# Patient Record
Sex: Female | Born: 1935 | Race: Black or African American | Hispanic: No | Marital: Single | Smoking: Former smoker
Health system: Southern US, Community
[De-identification: ages and names within clinical notes are randomized; demographics above are authoritative.]

## PROBLEM LIST (undated history)

## (undated) DIAGNOSIS — H18519 Endothelial corneal dystrophy, unspecified eye: Secondary | ICD-10-CM

## (undated) DIAGNOSIS — I10 Essential (primary) hypertension: Secondary | ICD-10-CM

## (undated) DIAGNOSIS — N189 Chronic kidney disease, unspecified: Secondary | ICD-10-CM

## (undated) DIAGNOSIS — F3289 Other specified depressive episodes: Secondary | ICD-10-CM

## (undated) DIAGNOSIS — N289 Disorder of kidney and ureter, unspecified: Secondary | ICD-10-CM

## (undated) DIAGNOSIS — H409 Unspecified glaucoma: Secondary | ICD-10-CM

## (undated) DIAGNOSIS — H548 Legal blindness, as defined in USA: Secondary | ICD-10-CM

## (undated) DIAGNOSIS — R52 Pain, unspecified: Secondary | ICD-10-CM

## (undated) DIAGNOSIS — H1851 Endothelial corneal dystrophy: Secondary | ICD-10-CM

## (undated) DIAGNOSIS — G309 Alzheimer's disease, unspecified: Secondary | ICD-10-CM

## (undated) DIAGNOSIS — F329 Major depressive disorder, single episode, unspecified: Secondary | ICD-10-CM

## (undated) DIAGNOSIS — F0390 Unspecified dementia without behavioral disturbance: Secondary | ICD-10-CM

## (undated) DIAGNOSIS — F0280 Dementia in other diseases classified elsewhere without behavioral disturbance: Secondary | ICD-10-CM

---

## 2000-10-25 ENCOUNTER — Emergency Department (HOSPITAL_COMMUNITY): Admission: EM | Admit: 2000-10-25 | Discharge: 2000-10-25 | Payer: Self-pay | Admitting: Emergency Medicine

## 2001-08-06 ENCOUNTER — Inpatient Hospital Stay (HOSPITAL_COMMUNITY): Admission: EM | Admit: 2001-08-06 | Discharge: 2001-08-07 | Payer: Self-pay | Admitting: Emergency Medicine

## 2006-03-31 ENCOUNTER — Emergency Department (HOSPITAL_COMMUNITY): Admission: EM | Admit: 2006-03-31 | Discharge: 2006-03-31 | Payer: Self-pay | Admitting: Emergency Medicine

## 2011-05-27 ENCOUNTER — Emergency Department (HOSPITAL_COMMUNITY): Payer: Medicare Other

## 2011-05-27 ENCOUNTER — Inpatient Hospital Stay (HOSPITAL_COMMUNITY)
Admission: EM | Admit: 2011-05-27 | Discharge: 2011-06-02 | DRG: 193 | Disposition: A | Discharge status: Skilled Nursing Facility | Payer: Medicare Other | Attending: Internal Medicine | Admitting: Internal Medicine

## 2011-05-27 DIAGNOSIS — F0390 Unspecified dementia without behavioral disturbance: Secondary | ICD-10-CM

## 2011-05-27 DIAGNOSIS — E876 Hypokalemia: Secondary | ICD-10-CM | POA: Diagnosis present

## 2011-05-27 DIAGNOSIS — N183 Chronic kidney disease, stage 3 unspecified: Secondary | ICD-10-CM | POA: Diagnosis present

## 2011-05-27 DIAGNOSIS — B954 Other streptococcus as the cause of diseases classified elsewhere: Secondary | ICD-10-CM | POA: Diagnosis present

## 2011-05-27 DIAGNOSIS — N181 Chronic kidney disease, stage 1: Secondary | ICD-10-CM | POA: Diagnosis present

## 2011-05-27 DIAGNOSIS — G92 Toxic encephalopathy: Secondary | ICD-10-CM | POA: Diagnosis present

## 2011-05-27 DIAGNOSIS — R509 Fever, unspecified: Secondary | ICD-10-CM

## 2011-05-27 DIAGNOSIS — G929 Unspecified toxic encephalopathy: Secondary | ICD-10-CM | POA: Diagnosis present

## 2011-05-27 DIAGNOSIS — A498 Other bacterial infections of unspecified site: Secondary | ICD-10-CM | POA: Diagnosis present

## 2011-05-27 DIAGNOSIS — R1310 Dysphagia, unspecified: Secondary | ICD-10-CM | POA: Diagnosis present

## 2011-05-27 DIAGNOSIS — J189 Pneumonia, unspecified organism: Principal | ICD-10-CM | POA: Diagnosis present

## 2011-05-27 DIAGNOSIS — N390 Urinary tract infection, site not specified: Secondary | ICD-10-CM | POA: Diagnosis present

## 2011-05-27 DIAGNOSIS — E119 Type 2 diabetes mellitus without complications: Secondary | ICD-10-CM | POA: Diagnosis present

## 2011-05-27 DIAGNOSIS — M810 Age-related osteoporosis without current pathological fracture: Secondary | ICD-10-CM | POA: Diagnosis present

## 2011-05-27 DIAGNOSIS — R4182 Altered mental status, unspecified: Secondary | ICD-10-CM

## 2011-05-27 DIAGNOSIS — I129 Hypertensive chronic kidney disease with stage 1 through stage 4 chronic kidney disease, or unspecified chronic kidney disease: Secondary | ICD-10-CM | POA: Diagnosis present

## 2011-05-27 DIAGNOSIS — I10 Essential (primary) hypertension: Secondary | ICD-10-CM

## 2011-05-27 DIAGNOSIS — H548 Legal blindness, as defined in USA: Secondary | ICD-10-CM | POA: Diagnosis present

## 2011-05-27 HISTORY — DX: Alzheimer's disease, unspecified: G30.9

## 2011-05-27 HISTORY — DX: Endothelial corneal dystrophy: H18.51

## 2011-05-27 HISTORY — DX: Chronic kidney disease, unspecified: N18.9

## 2011-05-27 HISTORY — DX: Major depressive disorder, single episode, unspecified: F32.9

## 2011-05-27 HISTORY — DX: Endothelial corneal dystrophy, unspecified eye: H18.519

## 2011-05-27 HISTORY — DX: Dementia in other diseases classified elsewhere, unspecified severity, without behavioral disturbance, psychotic disturbance, mood disturbance, and anxiety: F02.80

## 2011-05-27 HISTORY — DX: Unspecified dementia, unspecified severity, without behavioral disturbance, psychotic disturbance, mood disturbance, and anxiety: F03.90

## 2011-05-27 HISTORY — DX: Unspecified glaucoma: H40.9

## 2011-05-27 HISTORY — DX: Other specified depressive episodes: F32.89

## 2011-05-27 HISTORY — DX: Pain, unspecified: R52

## 2011-05-27 HISTORY — DX: Essential (primary) hypertension: I10

## 2011-05-27 HISTORY — DX: Legal blindness, as defined in USA: H54.8

## 2011-05-27 HISTORY — DX: Disorder of kidney and ureter, unspecified: N28.9

## 2011-05-27 LAB — COMPREHENSIVE METABOLIC PANEL
ALT: 13 U/L (ref 0–35)
Alkaline Phosphatase: 69 U/L (ref 39–117)
CO2: 25 mEq/L (ref 19–32)
Calcium: 9.5 mg/dL (ref 8.4–10.5)
Chloride: 103 mEq/L (ref 96–112)
GFR calc non Af Amer: 74 mL/min — ABNORMAL LOW (ref >90)
Glucose, Bld: 171 mg/dL — ABNORMAL HIGH (ref 70–99)
Potassium: 3.8 mEq/L (ref 3.5–5.1)
Total Bilirubin: 0.2 mg/dL — ABNORMAL LOW (ref 0.3–1.2)

## 2011-05-27 LAB — CBC
MCH: 29.9 pg (ref 26.0–34.0)
MCHC: 33.2 g/dL (ref 30.0–36.0)
Platelets: 209 10*3/uL (ref 150–400)
RBC: 4.48 MIL/uL (ref 3.87–5.11)
RDW: 13.6 % (ref 11.5–15.5)
WBC: 8 10*3/uL (ref 4.0–10.5)

## 2011-05-27 LAB — CULTURE, BLOOD (ROUTINE X 2)
Culture  Setup Time: 201301060220
Culture  Setup Time: 201301060220

## 2011-05-27 LAB — URINE MICROSCOPIC-ADD ON

## 2011-05-27 LAB — URINALYSIS, ROUTINE W REFLEX MICROSCOPIC
Specific Gravity, Urine: 1.03 (ref 1.005–1.030)
Urobilinogen, UA: 1 mg/dL (ref 0.0–1.0)

## 2011-05-27 LAB — LACTIC ACID, PLASMA: Lactic Acid, Venous: 1 mmol/L (ref 0.5–2.2)

## 2011-05-27 MED ORDER — PIPERACILLIN-TAZOBACTAM 3.375 G IVPB
3.3750 g | Freq: Once | INTRAVENOUS | Status: AC
  Administered 2011-05-28: 3.375 g via INTRAVENOUS
  Filled 2011-05-27: qty 50

## 2011-05-27 MED ORDER — VANCOMYCIN HCL IN DEXTROSE 1-5 GM/200ML-% IV SOLN
1000.0000 mg | Freq: Once | INTRAVENOUS | Status: AC
  Administered 2011-05-28: 1000 mg via INTRAVENOUS
  Filled 2011-05-27: qty 200

## 2011-05-27 NOTE — ED Notes (Signed)
Per [facility]- pt resides at [facility] place- staff reports pt with drease LOC, fever worse today- Pt presents with spontaneous movement- coughing up thick yellow brown mucous- Lung sounds rhonci- fever 101 per staff.

## 2011-05-27 NOTE — ED Notes (Signed)
Pts son Carlyon Prows would like to be notified if pt is admitted and moved to a room or if pt is discharged. His number is [phone]

## 2011-05-27 NOTE — ED Provider Notes (Signed)
History     CSN: [id number]  Arrival date & time 05/27/11  2136   First MD Initiated Contact with Patient 05/27/11 2217      Chief Complaint  Patient presents with  . Altered Mental Status  . Fever    Patient is a 78 y.o. female presenting with altered mental status and fever. The history is provided by the nursing home. The history is limited by the condition of the patient (Altered mental status and dementia).  Altered Mental Status  Fever Primary symptoms of the febrile illness include fever and altered mental status.   The patient was sent to [place] from a nursing home for development of new onset fever of 101 as well as decreased level of consciousness today at the nursing home.  There is no reported vomiting or diarrhea.  As reported that the patient has been coughing and had oxygen saturations of 80% in route.  Per documentation it appears as though the patient is a full code.  There is no additional information provided by the nursing home.  I've been unable to contact family at this time. Past Medical History  Diagnosis Date  . Alzheimer's disease   . Dementia   . Diabetes mellitus without mention of complication   . Endothelial corneal dystrophy   . Legal blindness, as defined in [place]   . Unspecified glaucoma   . Dysphagia   . Depressive disorder, not elsewhere classified   . Hypertension   . Chronic kidney disease, unspecified   . Renal disorder   . Generalized pain   . Osteoporosis     History reviewed. No pertinent past surgical history.  No family history on file.  History  Substance Use Topics  . Smoking status: Not on file  . Smokeless tobacco: Not on file  . Alcohol Use:      not able to assess    OB History    Grav Para Term Preterm Abortions TAB SAB Ect Mult Living                  Review of Systems  Unable to perform ROS Constitutional: Positive for fever.  Psychiatric/Behavioral: Positive for altered mental status.    Allergies  Review of  patient's allergies indicates no known allergies.  Home Medications   Current Outpatient Rx  Name Route Sig Dispense Refill  . ACETAMINOPHEN 325 MG PO TABS Oral Take 650 mg by mouth 2 (two) times daily. For pain      . AMLODIPINE BESYLATE 10 MG PO TABS Oral Take 10 mg by mouth daily.      . ASPIRIN 81 MG PO TABS Oral Take 81 mg by mouth daily.      Marland Kitchen CALCIUM CARBONATE-VITAMIN D 500-200 MG-UNIT PO TABS Oral Take 1 tablet by mouth 3 (three) times daily.      Marland Kitchen CRANBERRY 405 MG PO CAPS Oral Take 405 mg by mouth 2 (two) times daily.      Marland Kitchen ESCITALOPRAM OXALATE 10 MG PO TABS Oral Take 10 mg by mouth daily.      Marland Kitchen GALANTAMINE HYDROBROMIDE 12 MG PO TABS Oral Take 12 mg by mouth 2 (two) times daily.      . INSULIN ASPART 100 UNIT/ML [place] SOLN Subcutaneous Inject 8 Units into the skin 3 (three) times daily before meals.      . INSULIN GLARGINE 100 UNIT/ML [place] SOLN Subcutaneous Inject 30 Units into the skin at bedtime.      Marland Kitchen MEMANTINE HCL 10  MG PO TABS Oral Take 10 mg by mouth 2 (two) times daily.      Marland Kitchen PREDNISOLONE ACETATE 1 % OP SUSP Left Eye Place 1 drop into the left eye daily after breakfast.      . SENNOSIDES 8.6 MG PO TABS Oral Take 1 tablet by mouth daily.        BP 142/63  Pulse 96  Temp(Src) 99.5 F (37.5 C) (Core (Comment))  Resp 21  SpO2 %  Physical Exam  Nursing note and vitals reviewed. Constitutional: She appears well-developed and well-nourished. No distress.       Chronically ill-appearing  HENT:  Head: Normocephalic and atraumatic.  Eyes: EOM are normal.  Neck: Normal range of motion.  Cardiovascular: Normal rate, regular rhythm and normal heart sounds.   Pulmonary/Chest: Effort normal and breath sounds normal. She has no wheezes. She has no rales.       Diffuse coarse rhonchi bilaterally  Abdominal: Soft. She exhibits no distension. There is no tenderness.  Musculoskeletal: Normal range of motion.  Neurological:       Localizes to pain.  Does not follow commands.   Eyes closed  Skin: Skin is warm and dry.    ED Course  Procedures (including critical care time)  Labs Reviewed  COMPREHENSIVE METABOLIC PANEL - Abnormal; Notable for the following:    Glucose, Bld 171 (*)    Albumin 3.2 (*)    Total Bilirubin 0.2 (*)    GFR calc non Af Amer 74 (*)    GFR calc Af Amer 86 (*)    All other components within normal limits  URINALYSIS, ROUTINE W REFLEX MICROSCOPIC - Abnormal; Notable for the following:    APPearance CLOUDY (*)    Hgb urine dipstick LARGE (*)    Protein, ur 100 (*)    Nitrite POSITIVE (*)    Leukocytes, UA TRACE (*)    All other components within normal limits  URINE MICROSCOPIC-ADD ON - Abnormal; Notable for the following:    Squamous Epithelial / LPF MANY (*)    Bacteria, UA MANY (*)    All other components within normal limits  CBC  LACTIC ACID, PLASMA  CULTURE, BLOOD (ROUTINE X 2)  CULTURE, BLOOD (ROUTINE X 2)  URINE CULTURE   Dg Chest 2 View  05/27/2011  *RADIOLOGY REPORT*  Clinical Data: Fever and cough.  CHEST - 2 VIEW  Comparison: None  Findings: Left retrocardiac airspace disease/consolidation is noted suspicious for pneumonia. Cardiomegaly is identified in this low volume film. Mild peribronchial thickening is present. There is no evidence of pneumothorax or pleural effusion.  IMPRESSION: Left lower lung consolidation/airspace disease likely representing pneumonia.  Consider radiographic follow up to resolution.  Cardiomegaly.  Original Report Authenticated By: Rosendo Gros, M.D.   Ct Head Wo Contrast  05/28/2011  *RADIOLOGY REPORT*  Clinical Data: 78 year old female with altered mental status.  CT HEAD WITHOUT CONTRAST  Technique:  Contiguous axial images were obtained from the base of the skull through the vertex without contrast.  Comparison: 03/31/2006  Findings: Mild generalized cerebral volume loss is noted. Moderate chronic small vessel white matter ischemic changes are identified. A remote right cerebellar infarct is  identified.  No acute intracranial abnormalities are identified, including mass lesion or mass effect, hydrocephalus, extra-axial fluid collection, midline shift, hemorrhage, or acute infarction.  The visualized bony calvarium is unremarkable. A hyperdense right globe versus prosthesis is again noted.  IMPRESSION: No evidence of acute intracranial abnormality.  Atrophy, chronic small vessel  white matter ischemic changes and remote right cerebellar infarct.  Original Report Authenticated By: Rosendo Gros, M.D.   I personally reviewed the x-ray and CT scan  1. HCAP (healthcare-associated pneumonia)   2. Urinary tract infection   3. Fever   4. Altered mental status       MDM  Unclear etiology for fever and altered mental status at this time.  Labs and CT scan of her head are pending.  X-ray pending. pancultured  Patient with evidence of left lower lobe pneumonia.  Will cover is healthcare associated pneumonia with thank and Zosyn.  She also appears to have a urinary tract infection.  The Zosyn should cover her urine.  Urine cultures been obtained.  Her core temperature on temperature Foley his 101.  Her AMS is likely secondary to delirium in the setting of severe dementia.  Contact the hospitalist for admission        Lyanne Co, MD 05/28/11 0028

## 2011-05-27 NOTE — ED Notes (Signed)
Bed:WA07<BR> Expected date:<BR> Expected time:<BR> Means of arrival:<BR> Comments:<BR> EMS

## 2011-05-28 ENCOUNTER — Encounter (HOSPITAL_COMMUNITY): Payer: Self-pay | Admitting: Family Medicine

## 2011-05-28 DIAGNOSIS — I10 Essential (primary) hypertension: Secondary | ICD-10-CM

## 2011-05-28 DIAGNOSIS — J189 Pneumonia, unspecified organism: Secondary | ICD-10-CM | POA: Diagnosis present

## 2011-05-28 DIAGNOSIS — F0390 Unspecified dementia without behavioral disturbance: Secondary | ICD-10-CM

## 2011-05-28 LAB — BLOOD GAS, ARTERIAL
Bicarbonate: 27.1 mEq/L — ABNORMAL HIGH (ref 20.0–24.0)
Drawn by: 313061
Patient temperature: 98.6
TCO2: 24.2 mmol/L (ref 0–100)

## 2011-05-28 LAB — BASIC METABOLIC PANEL
BUN: 13 mg/dL (ref 6–23)
GFR calc non Af Amer: 76 mL/min — ABNORMAL LOW (ref >90)
Glucose, Bld: 159 mg/dL — ABNORMAL HIGH (ref 70–99)
Potassium: 3.9 mEq/L (ref 3.5–5.1)

## 2011-05-28 LAB — CBC
HCT: 37.7 % (ref 36.0–46.0)
Hemoglobin: 12.6 g/dL (ref 12.0–15.0)
MCH: 30.1 pg (ref 26.0–34.0)
MCHC: 33.4 g/dL (ref 30.0–36.0)
MCV: 90 fL (ref 78.0–100.0)

## 2011-05-28 LAB — GLUCOSE, CAPILLARY: Glucose-Capillary: 141 mg/dL — ABNORMAL HIGH (ref 70–99)

## 2011-05-28 MED ORDER — BENZONATATE 100 MG PO CAPS
100.0000 mg | ORAL_CAPSULE | Freq: Three times a day (TID) | ORAL | Status: DC | PRN
  Filled 2011-05-28: qty 1

## 2011-05-28 MED ORDER — SODIUM CHLORIDE 0.9 % IV SOLN
Freq: Once | INTRAVENOUS | Status: AC
  Administered 2011-05-28: via INTRAVENOUS

## 2011-05-28 MED ORDER — IPRATROPIUM BROMIDE 0.02 % IN SOLN: 0.5000 mg | RESPIRATORY_TRACT | Status: DC | PRN

## 2011-05-28 MED ORDER — ESCITALOPRAM OXALATE 10 MG PO TABS
10.0000 mg | ORAL_TABLET | Freq: Every day | ORAL | Status: DC
  Administered 2011-05-29 – 2011-06-01 (×4): 10 mg via ORAL
  Filled 2011-05-28 (×5): qty 1

## 2011-05-28 MED ORDER — ACETAMINOPHEN 325 MG PO TABS
650.0000 mg | ORAL_TABLET | Freq: Two times a day (BID) | ORAL | Status: DC
  Administered 2011-05-29 – 2011-06-01 (×9): 650 mg via ORAL
  Filled 2011-05-28 (×15): qty 2

## 2011-05-28 MED ORDER — ALUM & MAG HYDROXIDE-SIMETH 200-200-20 MG/5ML PO SUSP: 30.0000 mL | Freq: Four times a day (QID) | ORAL | Status: DC | PRN

## 2011-05-28 MED ORDER — INSULIN GLARGINE 100 UNIT/ML ~~LOC~~ SOLN: 20.0000 [IU] | Freq: Every day | SUBCUTANEOUS | Status: DC

## 2011-05-28 MED ORDER — AMLODIPINE BESYLATE 10 MG PO TABS
10.0000 mg | ORAL_TABLET | Freq: Every day | ORAL | Status: DC
  Administered 2011-05-29 – 2011-06-01 (×4): 10 mg via ORAL
  Filled 2011-05-28 (×6): qty 1

## 2011-05-28 MED ORDER — POTASSIUM CHLORIDE IN NACL 20-0.9 MEQ/L-% IV SOLN
INTRAVENOUS | Status: DC
  Administered 2011-05-28: 06:00:00 via INTRAVENOUS
  Filled 2011-05-28 (×4): qty 1000

## 2011-05-28 MED ORDER — PIPERACILLIN-TAZOBACTAM 3.375 G IVPB
3.3750 g | Freq: Three times a day (TID) | INTRAVENOUS | Status: DC
  Administered 2011-05-28 – 2011-05-30 (×6): 3.375 g via INTRAVENOUS
  Filled 2011-05-28 (×10): qty 50

## 2011-05-28 MED ORDER — ASPIRIN 81 MG PO CHEW
81.0000 mg | CHEWABLE_TABLET | Freq: Every day | ORAL | Status: DC
  Administered 2011-05-29 – 2011-06-01 (×4): 81 mg via ORAL
  Filled 2011-05-28 (×5): qty 1

## 2011-05-28 MED ORDER — ACETAMINOPHEN 650 MG RE SUPP: 650.0000 mg | Freq: Four times a day (QID) | RECTAL | Status: DC | PRN

## 2011-05-28 MED ORDER — ALBUTEROL SULFATE (5 MG/ML) 0.5% IN NEBU: 2.5000 mg | INHALATION_SOLUTION | RESPIRATORY_TRACT | Status: DC | PRN

## 2011-05-28 MED ORDER — ONDANSETRON HCL 4 MG/2ML IJ SOLN: 4.0000 mg | Freq: Four times a day (QID) | INTRAMUSCULAR | Status: DC | PRN

## 2011-05-28 MED ORDER — INSULIN GLARGINE 100 UNIT/ML ~~LOC~~ SOLN
10.0000 [IU] | Freq: Every day | SUBCUTANEOUS | Status: DC
  Administered 2011-05-29 – 2011-06-01 (×4): 10 [IU] via SUBCUTANEOUS
  Filled 2011-05-28: qty 3

## 2011-05-28 MED ORDER — GALANTAMINE HYDROBROMIDE 4 MG PO TABS
12.0000 mg | ORAL_TABLET | Freq: Two times a day (BID) | ORAL | Status: DC
  Administered 2011-05-28 – 2011-06-01 (×9): 12 mg via ORAL
  Filled 2011-05-28 (×12): qty 3

## 2011-05-28 MED ORDER — ACETAMINOPHEN 325 MG PO TABS: 650.0000 mg | ORAL_TABLET | Freq: Four times a day (QID) | ORAL | Status: DC | PRN

## 2011-05-28 MED ORDER — VANCOMYCIN HCL 1000 MG IV SOLR
750.0000 mg | Freq: Two times a day (BID) | INTRAVENOUS | Status: DC
  Administered 2011-05-28 – 2011-05-30 (×5): 750 mg via INTRAVENOUS
  Filled 2011-05-28 (×7): qty 750

## 2011-05-28 MED ORDER — MEMANTINE HCL 10 MG PO TABS
10.0000 mg | ORAL_TABLET | Freq: Two times a day (BID) | ORAL | Status: DC
  Administered 2011-05-28 – 2011-06-01 (×9): 10 mg via ORAL
  Filled 2011-05-28 (×12): qty 1

## 2011-05-28 MED ORDER — INSULIN ASPART 100 UNIT/ML ~~LOC~~ SOLN: 0.0000 [IU] | Freq: Every day | SUBCUTANEOUS | Status: DC

## 2011-05-28 MED ORDER — ONDANSETRON HCL 4 MG PO TABS: 4.0000 mg | ORAL_TABLET | Freq: Four times a day (QID) | ORAL | Status: DC | PRN

## 2011-05-28 MED ORDER — SODIUM CHLORIDE 0.9 % IV BOLUS (SEPSIS)
1000.0000 mL | Freq: Once | INTRAVENOUS | Status: AC
  Administered 2011-05-28: 1000 mL via INTRAVENOUS

## 2011-05-28 MED ORDER — ENOXAPARIN SODIUM 40 MG/0.4ML ~~LOC~~ SOLN
40.0000 mg | SUBCUTANEOUS | Status: DC
  Administered 2011-05-28 – 2011-06-01 (×5): 40 mg via SUBCUTANEOUS
  Filled 2011-05-28 (×6): qty 0.4

## 2011-05-28 MED ORDER — PREDNISOLONE ACETATE 1 % OP SUSP
1.0000 [drp] | Freq: Every day | OPHTHALMIC | Status: DC
  Administered 2011-05-28 – 2011-06-02 (×6): 1 [drp] via OPHTHALMIC
  Filled 2011-05-28: qty 1

## 2011-05-28 MED ORDER — INSULIN ASPART 100 UNIT/ML ~~LOC~~ SOLN
0.0000 [IU] | Freq: Three times a day (TID) | SUBCUTANEOUS | Status: DC
  Administered 2011-05-29 – 2011-05-31 (×3): 3 [IU] via SUBCUTANEOUS

## 2011-05-28 NOTE — ED Notes (Signed)
Family at bedside. Family given pt update. Family reports that pt has a prosthetic left eye ball, so unsure why pt get eye drops in left eye. RN reported she would pass this question along to the dr and that family could ask dr directed once pt in on floor.

## 2011-05-28 NOTE — ED Notes (Signed)
RT called to get sputum culture

## 2011-05-28 NOTE — ED Notes (Signed)
Pt is NPO and all medications are ordered for PO. RN called pharmacy and there are no IV forms of any of the medications due at 1000. Will alert dr.

## 2011-05-28 NOTE — ED Notes (Signed)
Pt not alert. Rn asked pt how she was doing and pt mumbled back, occasionally pt sings to herself. Withdrew from stimuli when RN administered eye drop. Respirations even and unlabored, bilateral symmetrical rise and fall of chest. Skin warm and dry. In no acute distress.

## 2011-05-28 NOTE — Progress Notes (Signed)
Critical Care Medicine asked to assess this pt for PCCM admission earlier .   This pt has UTI and PNA from SNF environment.  Although she has AMS, she is hemodynamically stable and appears appropriate for Hospitalist admission to SDU.  PCCM will be available prn.  Shan Levans  Beeper  [phone]  Cell  [phone]  If no response or cell goes to voicemail, call beeper [phone]

## 2011-05-28 NOTE — ED Notes (Signed)
Dr paged again to ask about pt medication administration.

## 2011-05-28 NOTE — ED Notes (Signed)
Dr. Gerri Lins paged and telephone order received to change bed status from step-down to tele.

## 2011-05-28 NOTE — ED Notes (Signed)
Dr Rama called RN and ordered all oral meds due at 1000 to be held at this time.

## 2011-05-28 NOTE — Progress Notes (Signed)
ANTIBIOTIC CONSULT NOTE - INITIAL  Pharmacy Consult for Vancomycin & Zosyn Indication: pneumonia  No Known Allergies  Patient Measurements: Estimated weight per ED RN = 160 pounds    Vital Signs: Temp: 99.2 F (37.3 C) (01/06 0328) Temp src: Core (Comment) (01/06 0328) BP: 118/52 mmHg (01/06 0328) Pulse Rate: 83  (01/06 0328) Intake/Output from previous day:   Intake/Output from this shift:    Labs:  Basename 05/27/11 2200  WBC 8.0  HGB 13.4  PLT 209  LABCREA --  CREATININE 0.78   CrCl is unknown because there is no height on file for the current visit. No results found for this basename: VANCOTROUGH:2,VANCOPEAK:2,VANCORANDOM:2,GENTTROUGH:2,GENTPEAK:2,GENTRANDOM:2,TOBRATROUGH:2,TOBRAPEAK:2,TOBRARND:2,AMIKACINPEAK:2,AMIKACINTROU:2,AMIKACIN:2, in the last 72 hours   Microbiology: No results found for this or any previous visit (from the past 720 hour(s)).  Medical History: Past Medical History  Diagnosis Date  . Alzheimer's disease   . Dementia   . Diabetes mellitus without mention of complication   . Endothelial corneal dystrophy   . Legal blindness, as defined in [place]   . Unspecified glaucoma   . Dysphagia   . Depressive disorder, not elsewhere classified   . Hypertension   . Chronic kidney disease, unspecified   . Renal disorder   . Generalized pain   . Osteoporosis     Medications:  Scheduled:    . sodium chloride   Intravenous Once  . acetaminophen  650 mg Oral BID  . amLODipine  10 mg Oral Daily  . aspirin  81 mg Oral Daily  . enoxaparin  40 mg Subcutaneous Q24H  . escitalopram  10 mg Oral Daily  . galantamine  12 mg Oral BID  . insulin aspart  0-15 Units Subcutaneous TID WC  . insulin aspart  0-5 Units Subcutaneous QHS  . insulin glargine  20 Units Subcutaneous QHS  . memantine  10 mg Oral BID  . piperacillin-tazobactam (ZOSYN)  IV  3.375 g Intravenous Once  . prednisoLONE acetate  1 drop Left Eye QPC breakfast  . sodium chloride  1,000 mL  Intravenous Once  . vancomycin  1,000 mg Intravenous Once   Infusions:    . 0.9 % NaCl with KCl 20 mEq / L     Assessment: 78 yr old female starting IV Vancomycin and Zosyn for likely pneumonia as seen on Chest Xray.  CrCl (n) = 58 ml/min.  At the time, patient is unable to be weighed in the ED and RN estimates weight to be around 160 pounds.  Will base initial dosing from this weight and will f/u once patient's actual weight is obtained to verify dosing regimen.  Patient received Vancomycin 1gm in ED @ 00:24 and Zosyn 3.375gm in ED @ 01:35.  Goal of Therapy:  Vancomycin trough level 15-20 mcg/ml  Plan:  1.  Zosyn 3.375gm IV q8h (each dose infused over 4 hrs) 2.  Vancomycin 750mg  IV q12h 3.  Will check vancomycin trough level as needed 4.  Will f/u actual patient weight once obtained and verify vancomycin regimen.  Tafari Humiston, Joselyn Glassman 05/28/2011,5:52 AM

## 2011-05-28 NOTE — Progress Notes (Signed)
Subjective: Pt opens eyes to voice, but does not respond verbally.  She does vocalize without words in a nonspecific way.  By that I mean that I am unable to determine if pt is trying to communicate distress, acknowledgment of my presence, agitation, or nothing at all.  Objective: Vital signs in last 24 hours: Temp:  [98 F (36.7 C)-101 F (38.3 C)] 98.7 F (37.1 C) (01/06 1159) Pulse Rate:  [69-96] 80  (01/06 1159) Resp:  [16-22] 16  (01/06 1159) BP: (112-164)/(35-71) 163/64 mmHg (01/06 1159) SpO2:  [95 %-100 %] 100 % (01/06 1159) Weight:  [72.576 kg (160 lb)] 160 lb (72.576 kg) (01/06 0627) Weight change:     Intake/Output from previous day:   Intake/Output this shift:   General: Pt is a cachectic, demented and chronically debilitated woman.  She ululates with any stimulation. Eyes:  Cataracts Lt Eye.  Doesn't open Rt eye.  Unable to examine EOMB due to patient's inability to cooperate with exam. Neck:  No JVD, No TM, No Thyromegaly Heart: RRR @ 80 BPM without murmur ectopy or gallup.  No lateral PMI, no thrills Lungs:  Decrease breathsounds throughout due to poor inspiratory effort.  Pt with significant rales LLL to mid lung range.  No rhonchi or wheezes auscultated. No tactile fremitus.  No increased work of breathing. Abd:  Soft Non-tender, non-distended, + bowel sounds CV:  LE negative for cyanosis, clubbing or edema, diminished popliteal or dorsalis pedis pulses. No carotid bruits. Neurological: unable to perform neurological exam due to patient's inability to cooperate with exam.  Lab Results:  Basename 05/28/11 0548 05/27/11 2200  WBC 6.0 8.0  HGB 12.6 13.4  HCT 37.7 40.4  PLT 202 209   BMET  Basename 05/28/11 0548 05/27/11 2200  NA 140 139  K 3.9 3.8  CL 106 103  CO2 26 25  GLUCOSE 159* 171*  BUN 13 15  CREATININE 0.73 0.78  CALCIUM 8.7 9.5    Studies/Results: Dg Chest 2 View  05/27/2011  *RADIOLOGY REPORT*  Clinical Data: Fever and cough.  CHEST - 2 VIEW   Comparison: None  Findings: Left retrocardiac airspace disease/consolidation is noted suspicious for pneumonia. Cardiomegaly is identified in this low volume film. Mild peribronchial thickening is present. There is no evidence of pneumothorax or pleural effusion.  IMPRESSION: Left lower lung consolidation/airspace disease likely representing pneumonia.  Consider radiographic follow up to resolution.  Cardiomegaly.  Original Report Authenticated By: Rosendo Gros, M.D.   Ct Head Wo Contrast  05/28/2011  *RADIOLOGY REPORT*  Clinical Data: 78 year old female with altered mental status.  CT HEAD WITHOUT CONTRAST  Technique:  Contiguous axial images were obtained from the base of the skull through the vertex without contrast.  Comparison: 03/31/2006  Findings: Mild generalized cerebral volume loss is noted. Moderate chronic small vessel white matter ischemic changes are identified. 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  No acute intracranial abnormalities are identified, including mass lesion or mass effect, hydrocephalus, extra-axial fluid collection, midline shift, hemorrhage, or acute infarction.  The visualized bony calvarium is unremarkable. A hyperdense right globe versus prosthesis is again noted.  IMPRESSION: No evidence of acute intracranial abnormality.  Atrophy, chronic small vessel white matter ischemic changes and remote right cerebellar infarct.  Original Report Authenticated By: Rosendo Gros, M.D.    Medications: I have reviewed the patient's current medications.  Assessment/Plan: 1.Pneumonia 2. Toxic Metabolic encephelopathy 3. UTI 4. Diabetis Mellititus 5. CKD Stage 1 6. Dementia 7. Dysphagia  Plan: 1.  Hydrate 2. IV Abx.  Must cover for HCAP due to patients residence in a nursing home. 3. Supplementary O2. 4. Reduce patient's standard dose of Lantus and hold bedtime coverage given pts altered mental status, dysphagia and NPO status and check 0300 FSBS.    LOS: 1 day    Darnell Jeschke 05/28/2011, 2:14 PM

## 2011-05-28 NOTE — ED Notes (Signed)
Pt NTS'd with small amount of blood tinged white thick secretions returned.  MD/ RN notified.

## 2011-05-28 NOTE — ED Notes (Signed)
Carlyon Prows (pt son) cell phone number [phone].   Requests to be contacted when pt is moved upstairs.

## 2011-05-28 NOTE — ED Notes (Signed)
Paged dr Darnelle Catalan at [phone], still have heard nothing back yet. Will page again in an hour if I dont hear anything

## 2011-05-28 NOTE — H&P (Addendum)
PCP:  unknown  Chief Complaint:    HPI: This is a 78 year old female resident of a nursing home, she has dementia. She was sent to the ER because she was febrile and more confused than baseline. Enroute patient was hypoxic. History obtained mainly from ER physician and chart as patient is unable to provide. Patient does not provide any history, she mostly sleeps but is arousable to painful stimuli.  Review of Systems:  Unable to obtain  Past Medical History: Past Medical History  Diagnosis Date  . Alzheimer's disease   . Dementia   . Diabetes mellitus without mention of complication   . Endothelial corneal dystrophy   . Legal blindness, as defined in [place]   . Unspecified glaucoma   . Dysphagia   . Depressive disorder, not elsewhere classified   . Hypertension   . Chronic kidney disease, unspecified   . Renal disorder   . Generalized pain   . Osteoporosis    History reviewed. No pertinent past surgical history.  Medications: Prior to Admission medications   Medication Sig Start Date End Date Taking? Authorizing Provider  acetaminophen (TYLENOL) 325 MG tablet Take 650 mg by mouth 2 (two) times daily. For pain     Yes Historical Provider, MD  amLODipine (NORVASC) 10 MG tablet Take 10 mg by mouth daily.     Yes Historical Provider, MD  aspirin 81 MG tablet Take 81 mg by mouth daily.     Yes Historical Provider, MD  calcium-vitamin D (OSCAL WITH D) 500-200 MG-UNIT per tablet Take 1 tablet by mouth 3 (three) times daily.     Yes Historical Provider, MD  Cranberry 405 MG CAPS Take 405 mg by mouth 2 (two) times daily.     Yes Historical Provider, MD  escitalopram (LEXAPRO) 10 MG tablet Take 10 mg by mouth daily.     Yes Historical Provider, MD  galantamine (RAZADYNE) 12 MG tablet Take 12 mg by mouth 2 (two) times daily.     Yes Historical Provider, MD  insulin aspart (NOVOLOG) 100 UNIT/ML injection Inject 8 Units into the skin 3 (three) times daily before meals.     Yes Historical  Provider, MD  insulin glargine (LANTUS) 100 UNIT/ML injection Inject 30 Units into the skin at bedtime.     Yes Historical Provider, MD  memantine (NAMENDA) 10 MG tablet Take 10 mg by mouth 2 (two) times daily.     Yes Historical Provider, MD  prednisoLONE acetate (PRED FORTE) 1 % ophthalmic suspension Place 1 drop into the left eye daily after breakfast.     Yes Historical Provider, MD  senna (SENOKOT) 8.6 MG tablet Take 1 tablet by mouth daily.     Yes Historical Provider, MD    Allergies:  No Known Allergies  Social History:  does not have a smoking history on file. She does not have any smokeless tobacco history on file. Her alcohol and drug histories not on file.  Family History: Unable to obtain  Physical Exam: Filed Vitals:   05/28/11 0100 05/28/11 0130 05/28/11 0200 05/28/11 0328  BP: 128/35 162/59 152/49 118/52  Pulse: 69 84  83  Temp: 100.6 F (38.1 C) 100.2 F (37.9 C) 99.5 F (37.5 C) 99.2 F (37.3 C)  TempSrc:    Core (Comment)  Resp: 18 22 19 20   SpO2: 100% 100%  95%    General:  Sleeping but arousable to painful stimuli, well developed and nourished, no acute distress Eyes: no scleral icterus, cataract left eye,  right eye closed with crustacean, but PERRLA ENT: dry oral mucosa, neck supple, no thyromegaly Lungs: mildly course, no crackles, no use of accessory muscles Cardiovascular: regular rate and rhythm, no regurgitation, no gallops, no murmurs. No carotid bruits, no JVD Abdomen: soft, positive BS, non-tender, non-distended, no organomegaly, not an acute abdomen GU: not examined Neuro: Unable to assess Musculoskeletal: Unable to assess no clubbing, cyanosis or edema Skin: no rash, no subcutaneous crepitation, no decubitus Psych: Demented female   Labs on Admission:   Basename 05/27/11 2200  NA 139  K 3.8  CL 103  CO2 25  GLUCOSE 171*  BUN 15  CREATININE 0.78  CALCIUM 9.5  MG --  PHOS --    Basename 05/27/11 2200  AST 18  ALT 13  ALKPHOS  69  BILITOT 0.2*  PROT 8.0  ALBUMIN 3.2*   No results found for this basename: LIPASE:2,AMYLASE:2 in the last 72 hours  Basename 05/27/11 2200  WBC 8.0  NEUTROABS --  HGB 13.4  HCT 40.4  MCV 90.2  PLT 209   No results found for this basename: CKTOTAL:3,CKMB:3,CKMBINDEX:3,TROPONINI:3 in the last 72 hours No components found with this basename: POCBNP:3 No results found for this basename: DDIMER:2 in the last 72 hours No results found for this basename: HGBA1C:2 in the last 72 hours No results found for this basename: CHOL:2,HDL:2,LDLCALC:2,TRIG:2,CHOLHDL:2,LDLDIRECT:2 in the last 72 hours No results found for this basename: TSH,T4TOTAL,FREET3,T3FREE,THYROIDAB in the last 72 hours No results found for this basename: VITAMINB12:2,FOLATE:2,FERRITIN:2,TIBC:2,IRON:2,RETICCTPCT:2 in the last 72 hours  Micro Results: No results found for this or any previous visit (from the past 240 hour(s)). Results for STEVEY, STAPLETON (MRN [id number]) as of 05/28/2011 04:19  Ref. Range 05/27/2011 22:36  Color, Urine Latest Range: YELLOW  YELLOW  APPearance Latest Range: CLEAR  CLOUDY (A)  Specific Gravity, Urine Latest Range: 1.005-1.030  1.030  pH Latest Range: 5.0-8.0  5.5  Glucose, UA Latest Range: NEGATIVE mg/dL NEGATIVE  Bilirubin Urine Latest Range: NEGATIVE  NEGATIVE  Ketones, ur Latest Range: NEGATIVE mg/dL NEGATIVE  Protein Latest Range: NEGATIVE mg/dL 782 (A)  Urobilinogen, UA Latest Range: 0.0-1.0 mg/dL 1.0  Nitrite Latest Range: NEGATIVE  POSITIVE (A)  Leukocytes, UA Latest Range: NEGATIVE  TRACE (A)  Urine-Other No range found MUCOUS PRESENT  WBC, UA Latest Range: <3 WBC/hpf 0-2  RBC / HPF Latest Range: <3 RBC/hpf 3-6  Squamous Epithelial / LPF Latest Range: RARE  MANY (A)  Bacteria, UA Latest Range: RARE  MANY (A)    Radiological Exams on Admission: Dg Chest 2 View  05/27/2011  *RADIOLOGY REPORT*  Clinical Data: Fever and cough.  CHEST - 2 VIEW  Comparison: None  Findings: Left  retrocardiac airspace disease/consolidation is noted suspicious for pneumonia. Cardiomegaly is identified in this low volume film. Mild peribronchial thickening is present. There is no evidence of pneumothorax or pleural effusion.  IMPRESSION: Left lower lung consolidation/airspace disease likely representing pneumonia.  Consider radiographic follow up to resolution.  Cardiomegaly.  Original Report Authenticated By: Rosendo Gros, M.D.   Ct Head Wo Contrast  05/28/2011  *RADIOLOGY REPORT*  Clinical Data: 78 year old female with altered mental status.  CT HEAD WITHOUT CONTRAST  Technique:  Contiguous axial images were obtained from the base of the skull through the vertex without contrast.  Comparison: 03/31/2006  Findings: Mild generalized cerebral volume loss is noted. Moderate chronic small vessel white matter ischemic changes are identified. 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  No acute intracranial abnormalities are identified, including  mass lesion or mass effect, hydrocephalus, extra-axial fluid collection, midline shift, hemorrhage, or acute infarction.  The visualized bony calvarium is unremarkable. A hyperdense right globe versus prosthesis is again noted.  IMPRESSION: No evidence of acute intracranial abnormality.  Atrophy, chronic small vessel white matter ischemic changes and remote right cerebellar infarct.  Original Report Authenticated By: Rosendo Gros, M.D.    Assessment/Plan Present on Admission:  .Pneumonia Encephalopathy Dementia Admit to step down, patient with somewhat decreased of consciousness but arousable Blood cultures urine cultures in ordered and antibiotics Nebulizer and oxygen Diabetes mellitus blind left eye Chronic kidney disease Stable,  DVT prophylaxis  Team 5/Dr. Lorel Monaco, Jeffrey Voth 05/28/2011, 4:10 AM

## 2011-05-29 ENCOUNTER — Encounter (HOSPITAL_COMMUNITY): Payer: Self-pay | Admitting: Internal Medicine

## 2011-05-29 LAB — DIFFERENTIAL
Eosinophils Relative: 2 % (ref 0–5)
Lymphocytes Relative: 46 % (ref 12–46)
Lymphs Abs: 2.1 10*3/uL (ref 0.7–4.0)
Monocytes Absolute: 0.3 10*3/uL (ref 0.1–1.0)

## 2011-05-29 LAB — BASIC METABOLIC PANEL
CO2: 23 mEq/L (ref 19–32)
Calcium: 8.3 mg/dL — ABNORMAL LOW (ref 8.4–10.5)
Chloride: 107 mEq/L (ref 96–112)
Glucose, Bld: 91 mg/dL (ref 70–99)
Sodium: 138 mEq/L (ref 135–145)

## 2011-05-29 LAB — HEMOGLOBIN A1C: Hgb A1c MFr Bld: 6.8 % — ABNORMAL HIGH (ref <5.7)

## 2011-05-29 LAB — CBC
HCT: 37.3 % (ref 36.0–46.0)
MCV: 89.4 fL (ref 78.0–100.0)
Platelets: 206 10*3/uL (ref 150–400)
RBC: 4.17 MIL/uL (ref 3.87–5.11)
WBC: 4.5 10*3/uL (ref 4.0–10.5)

## 2011-05-29 LAB — GLUCOSE, CAPILLARY
Glucose-Capillary: 113 mg/dL — ABNORMAL HIGH (ref 70–99)
Glucose-Capillary: 162 mg/dL — ABNORMAL HIGH (ref 70–99)

## 2011-05-29 NOTE — Progress Notes (Signed)
Subjective: Pt HOH, but responds verbally this am, but she is not oriented.  She does sing, but not any recognizeable tune or lyrics.  Pt states that she has no new complaints.  Sensorium improved over yesterday.  Objective: Vital signs in last 24 hours: Temp:  [96.8 F (36 C)-98.7 F (37.1 C)] 98 F (36.7 C) (01/07 0600) Pulse Rate:  [73-97] 73  (01/07 0600) Resp:  [14-18] 18  (01/07 0600) BP: (144-166)/(64-87) 151/80 mmHg (01/07 0600) SpO2:  [95 %-100 %] 97 % (01/07 0600) Weight:  [65 kg (143 lb 4.8 oz)] 143 lb 4.8 oz (65 kg) (01/06 1753) Weight change: -7.576 kg (-16 lb 11.2 oz)    Intake/Output from previous day:   Intake/Output this shift:    Head: Normocephalic, without obvious abnormality, atraumatic Eyes: negative findings: lids and lashes normal, conjunctivae and sclerae normal and Pt does not open rt eye.  Left lens clouded with cataracts., positive findings: Cataracts left eye. Neck: no adenopathy, no carotid bruit, no JVD, supple, symmetrical, trachea midline and thyroid not enlarged, symmetric, no tenderness/mass/nodules Resp: diminished breath sounds bilaterally, rales LLL and No rhonchi or wheezes auscultated.  No increased work of breathing. No dullness to percussion,. Cardio: regular rate and rhythm, S1, S2 normal, no murmur, click, rub or gallop and no rub GI: soft, non-tender; bowel sounds normal; no masses,  no organomegaly Extremities: extremities normal, atraumatic, no cyanosis or edema and no edema, redness or tenderness in the calves or thighs Pulses: 2+ and symmetric Skin: Skin color, texture, turgor normal. No rashes or lesions  Lab Results:  Basename 05/29/11 0555 05/28/11 0548  WBC 4.5 6.0  HGB 12.4 12.6  HCT 37.3 37.7  PLT 206 202   BMET  Basename 05/29/11 0555 05/28/11 0548  NA 138 140  K 3.5 3.9  CL 107 106  CO2 23 26  GLUCOSE 91 159*  BUN 8 13  CREATININE 0.53 0.73  CALCIUM 8.3* 8.7    Studies/Results: Dg Chest 2 View  05/27/2011   *RADIOLOGY REPORT*  Clinical Data: Fever and cough.  CHEST - 2 VIEW  Comparison: None  Findings: Left retrocardiac airspace disease/consolidation is noted suspicious for pneumonia. Cardiomegaly is identified in this low volume film. Mild peribronchial thickening is present. There is no evidence of pneumothorax or pleural effusion.  IMPRESSION: Left lower lung consolidation/airspace disease likely representing pneumonia.  Consider radiographic follow up to resolution.  Cardiomegaly.  Original Report Authenticated By: Rosendo Gros, M.D.   Ct Head Wo Contrast  05/28/2011  *RADIOLOGY REPORT*  Clinical Data: 78 year old female with altered mental status.  CT HEAD WITHOUT CONTRAST  Technique:  Contiguous axial images were obtained from the base of the skull through the vertex without contrast.  Comparison: 03/31/2006  Findings: Mild generalized cerebral volume loss is noted. Moderate chronic small vessel white matter ischemic changes are identified. 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  No acute intracranial abnormalities are identified, including mass lesion or mass effect, hydrocephalus, extra-axial fluid collection, midline shift, hemorrhage, or acute infarction.  The visualized bony calvarium is unremarkable. A hyperdense right globe versus prosthesis is again noted.  IMPRESSION: No evidence of acute intracranial abnormality.  Atrophy, chronic small vessel white matter ischemic changes and remote right cerebellar infarct.  Original Report Authenticated By: Rosendo Gros, M.D.    Medications: I have reviewed the patient's current medications.  Assessment/Plan: 1.Pneumonia  - IV antibiotics, mucolytics 2. Toxic Metabolic encephelopathy - improved, but pt still confused.  Demented at baseline. 3.  UTI -IV antibiotics 4. Diabetis Mellititus -FSBS and SSI. 5. CKD Stage 1 -  Stable 6. Dementia - continue home meds  7. Dysphagia aspiration precautions.   LOS: 2 days   Lurie Mullane 05/29/2011, 8:47  AM

## 2011-05-29 NOTE — Progress Notes (Signed)
Speech Language/Pathology Clinical/Bedside Swallow Evaluation Patient Details  Name: Adrienne Bailey MRN: [id number] DOB: 1923-07-23 Today's Date: 05/29/2011 [id number] Past Medical History:  Past Medical History  Diagnosis Date  . Alzheimer's disease   . Dementia   . Diabetes mellitus without mention of complication   . Endothelial corneal dystrophy   . Legal blindness, as defined in [place]   . Unspecified glaucoma   . Dysphagia   . Depressive disorder, not elsewhere classified   . Hypertension   . Chronic kidney disease, unspecified   . Renal disorder   . Generalized pain   . Osteoporosis    Past Surgical History: History reviewed. No pertinent past surgical history.   Assessment/Recommendations/Treatment Plan    SLP Assessment Clinical Impression Statement: Pt presents with excessive oral motor movement, ? neuromotor, but appears with functional swallow for soft foods/thin liquids.  Pt's blindness and left sided effects from previous CVA prevent her from feeding herself full meals, but pt able to self feed finger foods.  Due to lack of dentition, (few lower teeth-no upper), recommend a soft/pureed meats and thin liquid diet.  SLP phoned SNF SLP who reports pt has not been on caseload for at least over a  year.  Note pt on puree/thin diet at SNF.  Recommend follow up SLP at SNF for dysphagia management given multiple risk factors for aspiration pna including reliance on others for feeding, condition of dentition, etc.  Suspect this pt's swallow is at baseline.  SLP to follow briefly for tolerance and family education if they are present.   Other Related Risk Factors: History of pneumonia;History of dysphagia;Cognitive impairment;Previous CVA  Recommendations Solid Consistency: Dysphagia 3 (Mechanical soft) Liquid Consistency: Thin Medication Administration: Crushed with puree Supervision: Staff feed patient;Full supervision/cueing for compensatory strategies Compensations: Small  sips/bites;Slow rate;Check for pocketing Postural Changes and/or Swallow Maneuvers: Seated upright 90 degrees Oral Care Recommendations: Oral care QID Follow up Recommendations: Skilled Nursing facility  Treatment Plan Treatment Plan Recommendations: Therapy as outlined in treatment plan below Speech Therapy Frequency: min 2x/week Treatment Duration: 1 week Interventions: Compensatory techniques;Patient/family education  Prognosis Prognosis for Safe Diet Advancement: Fair Barriers to Reach Goals: Cognitive deficits  Individuals Consulted Consulted and Agree with Results and Recommendations: Patient unable/family or caregiver not available  Swallowing Goals  SLP Swallowing Goals Patient will utilize recommended strategies during swallow to increase swallowing safety with: Total assistance  Swallow Study Prior Functional Status   Long term resident of SNF, admitted with fevers, AMS. PMH + for dementia, UTI, cerebellar CVA (right), dysphagia, metabolic encephalopathy.  CT Head 05/27/10 with remote cerebellar CVA, nothing acute.  Chest xray 05/26/10 left lower lobe airspace disease-likely pna and cardiomegaly.      Oral Motor/Sensory Function  Overall Oral Motor/Sensory Function: Impaired at baseline (h/o right cerebellar cva, excessive oral movement baseline) Labial ROM: Reduced left Lingual ROM: Reduced left Facial ROM: Reduced left Facial Symmetry: Left droop  Consistency Results  Ice Chips Ice chips: Not tested  Thin Liquid Thin Liquid: Within functional limits Presentation: Cup;Straw;Self Fed Other Comments: self fed with hand over hand assist  Nectar Thick Liquid Nectar Thick Liquid: Not tested  Honey Thick Liquid Honey Thick Liquid: Not tested  Puree Puree: Within functional limits Presentation: Spoon  Solid Solid: Impaired Other Comments: self fed,  delayed mastication due to lack of dentition but pt able to clear oral cavity independently, excessive oral motor  movements -? source   Adrienne Bailey 05/29/2011,10:21 AM

## 2011-05-29 NOTE — Progress Notes (Signed)
INITIAL ADULT NUTRITION ASSESSMENT Date: 05/29/2011   Time: 11:36 AM Reason for Assessment: Nutrition risk   ASSESSMENT: Female 78 y.o.  Dx: Pneumonia  Hx:  Past Medical History  Diagnosis Date  . Alzheimer's disease   . Dementia   . Diabetes mellitus without mention of complication   . Endothelial corneal dystrophy   . Legal blindness, as defined in [place]   . Unspecified glaucoma   . Dysphagia   . Depressive disorder, not elsewhere classified   . Hypertension   . Chronic kidney disease, unspecified   . Renal disorder   . Generalized pain   . Osteoporosis    Related Meds:  Scheduled Meds:   . acetaminophen  650 mg Oral BID  . amLODipine  10 mg Oral Daily  . aspirin  81 mg Oral Daily  . enoxaparin  40 mg Subcutaneous Q24H  . escitalopram  10 mg Oral Daily  . galantamine  12 mg Oral BID  . insulin aspart  0-15 Units Subcutaneous TID WC  . insulin glargine  10 Units Subcutaneous QHS  . memantine  10 mg Oral BID  . piperacillin-tazobactam (ZOSYN)  IV  3.375 g Intravenous Q8H  . prednisoLONE acetate  1 drop Left Eye QPC breakfast  . vancomycin  750 mg Intravenous Q12H  . DISCONTD: insulin aspart  0-5 Units Subcutaneous QHS  . DISCONTD: insulin glargine  20 Units Subcutaneous QHS   Continuous Infusions:   . DISCONTD: 0.9 % NaCl with KCl 20 mEq / L 75 mL/hr at 05/28/11 0623   PRN Meds:.acetaminophen, acetaminophen, albuterol, alum & mag hydroxide-simeth, benzonatate, ipratropium, ondansetron (ZOFRAN) IV, ondansetron  Ht: 5\' 3"  (160 cm)  Wt: 143 lb 4.8 oz (65 kg)  Ideal Wt: 52.3kg % Ideal Wt: 124  Usual Wt: Unable to assess % Usual Wt: Unable to assess  Body mass index is 25.38 kg/(m^2).  Food/Nutrition Related Hx: Pt with Alzheimer's dementia, no family present. Pt screened for dysphagia PTA, noted SLP evaluation today mentioned pt on puree/thin at facility, however not followed by their SLP for at least over a year. Pt's diet upgraded to dysphagia 3 with thin  liquids, which RN states pt doing well with. Nursing states that even though pt with only a few lower teeth, she does well with chewing. Noted pt legally blind, however RN states pt requires only minimal assistance with meals.    DG of chest on 1/5 showed left lower lung consolidation/airspace disease likely representing pneumonia. Consider radiographic follow up to resolution. Cardiomegaly.  Labs:  CMP     Component Value Date/Time   NA 138 05/29/2011 0555   K 3.5 05/29/2011 0555   CL 107 05/29/2011 0555   CO2 23 05/29/2011 0555   GLUCOSE 91 05/29/2011 0555   BUN 8 05/29/2011 0555   CREATININE 0.53 05/29/2011 0555   CALCIUM 8.3* 05/29/2011 0555   PROT 8.0 05/27/2011 2200   ALBUMIN 3.2* 05/27/2011 2200   AST 18 05/27/2011 2200   ALT 13 05/27/2011 2200   ALKPHOS 69 05/27/2011 2200   BILITOT 0.2* 05/27/2011 2200   GFRNONAA 84* 05/29/2011 0555   GFRAA >90 05/29/2011 0555   CBG (last 3)   Basename 05/29/11 0723 05/28/11 1454 05/28/11 1156  GLUCAP 85 139* 111*   No results found for this basename: HGBA1C   No intake or output data in the 24 hours ending 05/29/11 1140   Diet Order: Dysphagia  IVF:    DISCONTD: 0.9 % NaCl with KCl 20 mEq / L  Last Rate: 75 mL/hr at 05/28/11 1610    Estimated Nutritional Needs:   Kcal:1650-1900 Protein:65-80g Fluid:1.6-1.9L  NUTRITION DIAGNOSIS: -Swallowing difficulty (NI-1.1).  Status: Ongoing  RELATED TO: previous CVA, dementia  AS EVIDENCE BY: SLP evaluation, dysphagia PTA   MONITORING/EVALUATION(Goals): Pt able to safely consume >90% of meals.  EDUCATION NEEDS: -Education not appropriate at this time  INTERVENTION: Nursing to continue to assist with meals and encourage increased intake. Will monitor.   Dietitian #: [phone]  DOCUMENTATION CODES Per approved criteria  -Not Applicable    Marshall Cork 05/29/2011, 11:36 AM

## 2011-05-30 DIAGNOSIS — N390 Urinary tract infection, site not specified: Secondary | ICD-10-CM | POA: Diagnosis present

## 2011-05-30 DIAGNOSIS — R1310 Dysphagia, unspecified: Secondary | ICD-10-CM | POA: Diagnosis present

## 2011-05-30 DIAGNOSIS — G92 Toxic encephalopathy: Secondary | ICD-10-CM | POA: Diagnosis present

## 2011-05-30 DIAGNOSIS — E119 Type 2 diabetes mellitus without complications: Secondary | ICD-10-CM | POA: Diagnosis present

## 2011-05-30 LAB — URINE CULTURE
Colony Count: >=100000
Culture  Setup Time: 201301060221

## 2011-05-30 LAB — CULTURE, RESPIRATORY W GRAM STAIN: Gram Stain: NONE SEEN

## 2011-05-30 LAB — VANCOMYCIN, TROUGH: Vancomycin Tr: 17.2 ug/mL (ref 10.0–20.0)

## 2011-05-30 LAB — GLUCOSE, CAPILLARY
Glucose-Capillary: 90 mg/dL (ref 70–99)
Glucose-Capillary: 92 mg/dL (ref 70–99)

## 2011-05-30 MED ORDER — DEXTROSE 5 % IV SOLN
1.0000 g | INTRAVENOUS | Status: DC
  Administered 2011-05-30 – 2011-05-31 (×2): 1 g via INTRAVENOUS
  Filled 2011-05-30 (×3): qty 10

## 2011-05-30 NOTE — Progress Notes (Signed)
Pharmacy:  Vancomycin & Zosyn Consult  78 yo female SNF resident on Day #3 Vanc 750 mg IV q12h and Zosyn EI for HCAP and UTI. Afebrile, WBC wnl. Scr stable.  1/5 Bcx NGTD 1/5 Ucx + E.coli (resis. To ampicillin, cipro, levo, septra) 1/6 Sputum cx NGTD   Vancomycin level at steady state = 17.2 (goal 15-20).  Therapeutic.    Plan:  Continue Vancomycin and Zosyn at current dose

## 2011-05-30 NOTE — Progress Notes (Signed)
PATIENT DETAILS Name: Adrienne Bailey Age: 78 y.o. Sex: female Date of Birth: 14-Apr-1924 Admit Date: 05/27/2011 PCP:No primary provider on file. Emergency contact: Carlyon Prows (son): [phone]   CONSULTS: None  Interval History: Adrienne Bailey is an 78 year old female with a PMH of dementia who resides at a SNF and who was brought to the hospital on 05/28/11 with hypoxia and fever.  She is being treated with Vancomycin and Zosyn for HAP and UTI.   ROS: Adrienne Bailey is largely non-verbal and unable    Objective: Vital signs in last 24 hours: Temp:  [98 F (36.7 C)-98.4 F (36.9 C)] 98.1 F (36.7 C) (01/08 1534) Pulse Rate:  [68-83] 70  (01/08 1534) Resp:  [18-19] 18  (01/08 1534) BP: (141-154)/(63-89) 144/63 mmHg (01/08 1534) SpO2:  [95 %-97 %] 97 % (01/08 1534) Weight:  [66 kg (145 lb 8.1 oz)] 145 lb 8.1 oz (66 kg) (01/08 0552) Weight change: 1 kg (2 lb 3.3 oz)    Intake/Output from previous day:  Intake/Output Summary (Last 24 hours) at 05/30/11 1615 Last data filed at 05/30/11 1535  Gross per 24 hour  Intake    200 ml  Output    850 ml  Net   -650 ml     Physical Exam:  Gen:  Lethargic but responds to voice HEENT: Prosthetic left eye Cardiovascular:  RRR, No M/R/G Respiratory: Decreased breath sounds with poor effort Gastrointestinal: Abdomen soft, NT/ND, BS present x 4 Extremities: Trace edema   Lab Results: Basic Metabolic Panel:  Lab 05/29/11 6301 05/28/11 0548 05/27/11 2200  NA 138 140 139  K 3.5 3.9 --  CL 107 106 103  CO2 23 26 25   GLUCOSE 91 159* 171*  BUN 8 13 15   CREATININE 0.53 0.73 0.78  CALCIUM 8.3* 8.7 9.5  MG -- -- --  PHOS -- -- --   GFR Estimated Creatinine Clearance: 46.9 ml/min (by C-G formula based on Cr of 0.53). Liver Function Tests:  Lab 05/27/11 2200  AST 18  ALT 13  ALKPHOS 69  BILITOT 0.2*  PROT 8.0  ALBUMIN 3.2*    CBC:  Lab 05/29/11 0555 05/28/11 0548 05/27/11 2200  WBC 4.5 6.0 8.0  NEUTROABS 2.0 -- --    HGB 12.4 12.6 13.4  HCT 37.3 37.7 40.4  MCV 89.4 90.0 90.2  PLT 206 202 209   CBG:  Lab 05/30/11 1535 05/29/11 2118 05/29/11 1710 05/29/11 1143 05/29/11 0723  GLUCAP 87 171* 162* 113* 85   Hgb A1c  Basename 05/29/11 0555  HGBA1C 6.8*   Microbiology Recent Results (from the past 240 hour(s))  CULTURE, BLOOD (ROUTINE X 2)     Status: Normal (Preliminary result)   Collection Time   05/27/11 10:20 PM      Component Value Range Status Comment   Specimen Description BLOOD LEFT ANTERIOR FOREARM   Final    Special Requests BOTTLES DRAWN AEROBIC AND ANAEROBIC  5 CC EA   Final    Setup Time [id number]   Final    Culture     Final    Value:        BLOOD CULTURE RECEIVED NO GROWTH TO DATE CULTURE WILL BE HELD FOR 5 DAYS BEFORE ISSUING A FINAL NEGATIVE REPORT   Report Status PENDING   Incomplete   CULTURE, BLOOD (ROUTINE X 2)     Status: Normal (Preliminary result)   Collection Time   05/27/11 10:24 PM      Component Value Range Status  Comment   Specimen Description BLOOD LEFT LATERAL FOREARM   Final    Special Requests BOTTLES DRAWN AEROBIC AND ANAEROBIC 5 CC EA   Final    Setup Time [id number]   Final    Culture     Final    Value:        BLOOD CULTURE RECEIVED NO GROWTH TO DATE CULTURE WILL BE HELD FOR 5 DAYS BEFORE ISSUING A FINAL NEGATIVE REPORT   Report Status PENDING   Incomplete   URINE CULTURE     Status: Normal   Collection Time   05/27/11 10:37 PM      Component Value Range Status Comment   Specimen Description URINE, CATHETERIZED   Final    Special Requests NONE   Final    Setup Time [id number]   Final    Colony Count >=100,000 COLONIES/ML   Final    Culture     Final    Value: ESCHERICHIA COLI     VIRIDANS STREPTOCOCCUS   Report Status 05/30/2011 FINAL   Final    Organism ID, Bacteria ESCHERICHIA COLI   Final   CULTURE, RESPIRATORY     Status: Normal   Collection Time   05/28/11 10:12 AM      Component Value Range Status Comment   Specimen Description SPUTUM    Final    Special Requests NONE   Final    Gram Stain     Final    Value: NO WBC SEEN     FEW SQUAMOUS EPITHELIAL CELLS PRESENT     NO ORGANISMS SEEN   Culture MODERATE CANDIDA ALBICANS   Final    Report Status 05/30/2011 FINAL   Final     Recent Results (from the past 240 hour(s))  CULTURE, BLOOD (ROUTINE X 2)     Status: Normal (Preliminary result)   Collection Time   05/27/11 10:20 PM      Component Value Range Status Comment   Specimen Description BLOOD LEFT ANTERIOR FOREARM   Final    Special Requests BOTTLES DRAWN AEROBIC AND ANAEROBIC  5 CC EA   Final    Setup Time [id number]   Final    Culture     Final    Value:        BLOOD CULTURE RECEIVED NO GROWTH TO DATE CULTURE WILL BE HELD FOR 5 DAYS BEFORE ISSUING A FINAL NEGATIVE REPORT   Report Status PENDING   Incomplete   CULTURE, BLOOD (ROUTINE X 2)     Status: Normal (Preliminary result)   Collection Time   05/27/11 10:24 PM      Component Value Range Status Comment   Specimen Description BLOOD LEFT LATERAL FOREARM   Final    Special Requests BOTTLES DRAWN AEROBIC AND ANAEROBIC 5 CC EA   Final    Setup Time [id number]   Final    Culture     Final    Value:        BLOOD CULTURE RECEIVED NO GROWTH TO DATE CULTURE WILL BE HELD FOR 5 DAYS BEFORE ISSUING A FINAL NEGATIVE REPORT   Report Status PENDING   Incomplete   URINE CULTURE     Status: Normal   Collection Time   05/27/11 10:37 PM      Component Value Range Status Comment   Specimen Description URINE, CATHETERIZED   Final    Special Requests NONE   Final    Setup Time [id number]   Final    Colony Count >=100,000  COLONIES/ML   Final    Culture     Final    Value: ESCHERICHIA COLI     VIRIDANS STREPTOCOCCUS   Report Status 05/30/2011 FINAL   Final    Organism ID, Bacteria ESCHERICHIA COLI   Final   CULTURE, RESPIRATORY     Status: Normal   Collection Time   05/28/11 10:12 AM      Component Value Range Status Comment   Specimen Description SPUTUM   Final    Special  Requests NONE   Final    Gram Stain     Final    Value: NO WBC SEEN     FEW SQUAMOUS EPITHELIAL CELLS PRESENT     NO ORGANISMS SEEN   Culture MODERATE CANDIDA ALBICANS   Final    Report Status 05/30/2011 FINAL   Final     Studies/Results: No results found.  Medications: Scheduled Meds:    . acetaminophen  650 mg Oral BID  . amLODipine  10 mg Oral Daily  . aspirin  81 mg Oral Daily  . enoxaparin  40 mg Subcutaneous Q24H  . escitalopram  10 mg Oral Daily  . galantamine  12 mg Oral BID  . insulin aspart  0-15 Units Subcutaneous TID WC  . insulin glargine  10 Units Subcutaneous QHS  . memantine  10 mg Oral BID  . piperacillin-tazobactam (ZOSYN)  IV  3.375 g Intravenous Q8H  . prednisoLONE acetate  1 drop Left Eye QPC breakfast  . vancomycin  750 mg Intravenous Q12H   Continuous Infusions:  PRN Meds:.acetaminophen, acetaminophen, albuterol, alum & mag hydroxide-simeth, benzonatate, ipratropium, ondansetron (ZOFRAN) IV, ondansetron Antibiotics: Anti-infectives     Start     Dose/Rate Route Frequency Ordered Stop   05/28/11 1200   vancomycin (VANCOCIN) 750 mg in sodium chloride 0.9 % 150 mL IVPB        750 mg 150 mL/hr over 60 Minutes Intravenous Every 12 hours 05/28/11 0559     05/28/11 0930   piperacillin-tazobactam (ZOSYN) IVPB 3.375 g        3.375 g 12.5 mL/hr over 240 Minutes Intravenous Every 8 hours 05/28/11 0559     05/27/11 2330   vancomycin (VANCOCIN) IVPB 1000 mg/200 mL premix        1,000 mg 200 mL/hr over 60 Minutes Intravenous  Once 05/27/11 2316 05/28/11 0124   05/27/11 2330   piperacillin-tazobactam (ZOSYN) IVPB 3.375 g        3.375 g 12.5 mL/hr over 240 Minutes Intravenous  Once 05/27/11 2316 05/28/11 0535           Assessment/Plan:  Principal Problem:  *Pneumonia The patient was admitted and put on broad spectrum antibiotics consisting of Vancomycin and Zosyn.  Blood cultures were obtained and are negative to date.  Sputum cultures are growing  yeast. The patient has a history of dementia/dysphagia and is from a SNF, so she was being treated for HAP.  Given sputum culture only growing yeast, will narrow Zosyn to Rocephin, and discontinue Vancomycin. Active Problems:  Hypertension Controlled on Amlodipine.  Dementia Continue pre-admission medications: galantamine, memantine.  CKD (chronic kidney disease) stage 3, GFR 30-59 ml/min Creatinine improved over course of hospital stay.  Baseline creatinine unknown.  UTI (lower urinary tract infection) Cultures growing E. Coli and Strep Viridans.  Change Zosyn to Rocephin.  Dysphagia The patient was seen and evaluated by the speech therapist on 05/29/11.  She was placed on a dysphagia 3 diet with thin liquids per recommendations.  DM (diabetes mellitus) The patient's hemoglobin A1c was checked and found to be 6.8% indicating good outpatient control.  Her CBGs over the past 24 hours were 85-171.  Continue Lantus, and moderate scale SSI.  Toxic encephalopathy Unclear baseline mental status, but son states she is not very verbal at baseline.  I spoke with the patient's son, Luisa Hart, by telephone, and updated him on the patient's condition and plan of care.    LOS: 3 days   Hillery Aldo, MD Pager [phone]  05/30/2011, 4:15 PM

## 2011-05-31 LAB — CBC
HCT: 37.5 % (ref 36.0–46.0)
MCV: 87.8 fL (ref 78.0–100.0)
Platelets: 242 10*3/uL (ref 150–400)
RBC: 4.27 MIL/uL (ref 3.87–5.11)
RDW: 13 % (ref 11.5–15.5)
WBC: 5.9 10*3/uL (ref 4.0–10.5)

## 2011-05-31 LAB — BASIC METABOLIC PANEL
CO2: 24 mEq/L (ref 19–32)
Chloride: 108 mEq/L (ref 96–112)
Creatinine, Ser: 0.62 mg/dL (ref 0.50–1.10)
GFR calc Af Amer: >90 mL/min (ref >90)
Potassium: 2.7 mEq/L — CL (ref 3.5–5.1)

## 2011-05-31 LAB — GLUCOSE, CAPILLARY
Glucose-Capillary: 72 mg/dL (ref 70–99)
Glucose-Capillary: 97 mg/dL (ref 70–99)

## 2011-05-31 LAB — MAGNESIUM: Magnesium: 2 mg/dL (ref 1.5–2.5)

## 2011-05-31 MED ORDER — POTASSIUM CHLORIDE 10 MEQ/100ML IV SOLN
10.0000 meq | INTRAVENOUS | Status: AC
Start: 2011-05-31 — End: 2011-05-31
  Administered 2011-05-31 (×4): 10 meq via INTRAVENOUS
  Filled 2011-05-31 (×4): qty 100

## 2011-05-31 MED ORDER — POTASSIUM CHLORIDE CRYS ER 20 MEQ PO TBCR
20.0000 meq | EXTENDED_RELEASE_TABLET | Freq: Two times a day (BID) | ORAL | Status: DC
  Administered 2011-05-31 – 2011-06-01 (×4): 20 meq via ORAL
  Filled 2011-05-31 (×6): qty 1

## 2011-05-31 NOTE — Progress Notes (Signed)
Pt's K+ level this morning is 2.7, the on call MD notified. Please see Epic for order details.

## 2011-05-31 NOTE — Progress Notes (Signed)
OT Screen Order received, chart reviewed. Phoned [facility] where pt resides. Per staff, pt is total care for all ADLs and mobility at baseline. Mechanical lift used for OOB. Pt presents with no OT needs at this time. Will sign off.  Garrel Ridgel, OTR/L  Pager [phone] 05/31/2011

## 2011-05-31 NOTE — Progress Notes (Signed)
Subjective: Patient is pleasantly confused.  Objective: Vital signs in last 24 hours: Filed Vitals:   05/30/11 1534 05/30/11 2202 05/31/11 0525 05/31/11 1434  BP: 144/63 166/84 146/57 137/76  Pulse: 70 80 78 77  Temp: 98.1 F (36.7 C) 98.6 F (37 C) 98.4 F (36.9 C) 98.1 F (36.7 C)  TempSrc: Oral Oral Oral Oral  Resp: 18 18 18 18   Height:      Weight:      SpO2: 97% 99% 98% 95%   Weight change:   Intake/Output Summary (Last 24 hours) at 05/31/11 1822 Last data filed at 05/31/11 1437  Gross per 24 hour  Intake    320 ml  Output   1150 ml  Net   -830 ml    Physical Exam: General: Awake, Oriented, No acute distress. HEENT: EOMI. Neck: Supple CV: S1 and S2 Lungs: Clear to ascultation bilaterally Abdomen: Soft, Nontender, Nondistended, +bowel sounds.  Lab Results:  Basename 05/31/11 0515 05/29/11 0555  NA 142 138  K 2.7* 3.5  CL 108 107  CO2 24 23  GLUCOSE 66* 91  BUN 6 8  CREATININE 0.62 0.53  CALCIUM 8.9 8.3*  MG 2.0 --  PHOS -- --   No results found for this basename: AST:2,ALT:2,ALKPHOS:2,BILITOT:2,PROT:2,ALBUMIN:2 in the last 72 hours No results found for this basename: LIPASE:2,AMYLASE:2 in the last 72 hours  Basename 05/31/11 0515 05/29/11 0555  WBC 5.9 4.5  NEUTROABS -- 2.0  HGB 12.8 12.4  HCT 37.5 37.3  MCV 87.8 89.4  PLT 242 206   No results found for this basename: CKTOTAL:3,CKMB:3,CKMBINDEX:3,TROPONINI:3 in the last 72 hours No components found with this basename: POCBNP:3 No results found for this basename: DDIMER:2 in the last 72 hours  Basename 05/29/11 0555  HGBA1C 6.8*   No results found for this basename: CHOL:2,HDL:2,LDLCALC:2,TRIG:2,CHOLHDL:2,LDLDIRECT:2 in the last 72 hours No results found for this basename: TSH,T4TOTAL,FREET3,T3FREE,THYROIDAB in the last 72 hours No results found for this basename: VITAMINB12:2,FOLATE:2,FERRITIN:2,TIBC:2,IRON:2,RETICCTPCT:2 in the last 72 hours  Micro Results: Recent Results (from the past  240 hour(s))  CULTURE, BLOOD (ROUTINE X 2)     Status: Normal (Preliminary result)   Collection Time   05/27/11 10:20 PM      Component Value Range Status Comment   Specimen Description BLOOD LEFT ANTERIOR FOREARM   Final    Special Requests BOTTLES DRAWN AEROBIC AND ANAEROBIC  5 CC EA   Final    Setup Time [id number]   Final    Culture     Final    Value:        BLOOD CULTURE RECEIVED NO GROWTH TO DATE CULTURE WILL BE HELD FOR 5 DAYS BEFORE ISSUING A FINAL NEGATIVE REPORT   Report Status PENDING   Incomplete   CULTURE, BLOOD (ROUTINE X 2)     Status: Normal (Preliminary result)   Collection Time   05/27/11 10:24 PM      Component Value Range Status Comment   Specimen Description BLOOD LEFT LATERAL FOREARM   Final    Special Requests BOTTLES DRAWN AEROBIC AND ANAEROBIC 5 CC EA   Final    Setup Time [id number]   Final    Culture     Final    Value:        BLOOD CULTURE RECEIVED NO GROWTH TO DATE CULTURE WILL BE HELD FOR 5 DAYS BEFORE ISSUING A FINAL NEGATIVE REPORT   Report Status PENDING   Incomplete   URINE CULTURE     Status: Normal  Collection Time   05/27/11 10:37 PM      Component Value Range Status Comment   Specimen Description URINE, CATHETERIZED   Final    Special Requests NONE   Final    Setup Time [id number]   Final    Colony Count >=100,000 COLONIES/ML   Final    Culture     Final    Value: ESCHERICHIA COLI     VIRIDANS STREPTOCOCCUS   Report Status 05/30/2011 FINAL   Final    Organism ID, Bacteria ESCHERICHIA COLI   Final   CULTURE, RESPIRATORY     Status: Normal   Collection Time   05/28/11 10:12 AM      Component Value Range Status Comment   Specimen Description SPUTUM   Final    Special Requests NONE   Final    Gram Stain     Final    Value: NO WBC SEEN     FEW SQUAMOUS EPITHELIAL CELLS PRESENT     NO ORGANISMS SEEN   Culture MODERATE CANDIDA ALBICANS   Final    Report Status 05/30/2011 FINAL   Final     Studies/Results: No results  found.  Medications: I have reviewed the patient's current medications. Scheduled Meds:   . acetaminophen  650 mg Oral BID  . amLODipine  10 mg Oral Daily  . aspirin  81 mg Oral Daily  . cefTRIAXone (ROCEPHIN)  IV  1 g Intravenous Q24H  . enoxaparin  40 mg Subcutaneous Q24H  . escitalopram  10 mg Oral Daily  . galantamine  12 mg Oral BID  . insulin aspart  0-15 Units Subcutaneous TID WC  . insulin glargine  10 Units Subcutaneous QHS  . memantine  10 mg Oral BID  . potassium chloride  10 mEq Intravenous Q1 Hr x 4  . potassium chloride  20 mEq Oral BID  . prednisoLONE acetate  1 drop Left Eye QPC breakfast   Continuous Infusions:  PRN Meds:.acetaminophen, acetaminophen, albuterol, alum & mag hydroxide-simeth, benzonatate, ipratropium, ondansetron (ZOFRAN) IV, ondansetron  Assessment/Plan: 1. Pneumonia  Initally the patient was started on broad spectrum antibiotics consisting of Vancomycin and Zosyn. Blood cultures were obtained and are negative to date. Sputum cultures are growing yeast. The patient has a history of dementia/dysphagia and is from a SNF, so she was being treated for HAP. Given sputum culture only growing yeast, will narrow Zosyn to Rocephin, and discontinue Vancomycin.  Antibiotics since 05/27/2010.  2. UTI (lower urinary tract infection)  Cultures growing E. Coli and Strep Viridans. Change Zosyn to Rocephin.   3. Hypertension  Controlled on Amlodipine.   4.  Hypokalemia Replace when necessary.  5. Dementia  Continue pre-admission medications: galantamine, memantine.   6. CKD (chronic kidney disease) stage 1 Creatinine improved over course of hospital stay. Baseline creatinine unknown.   7. Dysphagia  The patient was seen and evaluated by the speech therapist on 05/29/11. She was placed on a dysphagia 3 diet with thin liquids.   8. DM (diabetes mellitus)  The patient's hemoglobin A1c was checked and found to be 6.8% indicating good outpatient control. Her CBGs  over the past 24 hours were 85-171. Continue Lantus, and moderate scale SSI.   9. Toxic encephalopathy  Unclear baseline mental status, but is largely nonverbal at baseline.  10.  Disposition. Pending.   LOS: 4 days  Shayde Gervacio A, MD 05/31/2011, 6:22 PM\r

## 2011-05-31 NOTE — Progress Notes (Signed)
Physical Therapy Note  Order received. Chart reviewed. Contacted [facility] for PLOF and pt's mobility status. Per facility, pt is total care for ADLs and mobility. Pt requires hoyer lift for transfers to wheelchair.Recommend nursing use lift equipment for OOB to chair. Pt is not appropriate for therapy in this setting. Physical therapy will sign off. Thanks.

## 2011-06-01 LAB — GLUCOSE, CAPILLARY
Glucose-Capillary: 97 mg/dL (ref 70–99)
Glucose-Capillary: 105 mg/dL — ABNORMAL HIGH (ref 70–99)

## 2011-06-01 LAB — BASIC METABOLIC PANEL
BUN: 6 mg/dL (ref 6–23)
CO2: 22 mEq/L (ref 19–32)
Chloride: 110 mEq/L (ref 96–112)
GFR calc non Af Amer: 80 mL/min — ABNORMAL LOW (ref >90)
Glucose, Bld: 90 mg/dL (ref 70–99)
Potassium: 3.7 mEq/L (ref 3.5–5.1)
Sodium: 143 mEq/L (ref 135–145)

## 2011-06-01 MED ORDER — CEFUROXIME AXETIL 500 MG PO TABS: 500.0000 mg | ORAL_TABLET | Freq: Two times a day (BID) | ORAL | Status: AC

## 2011-06-01 MED ORDER — AZITHROMYCIN 250 MG PO TABS
250.0000 mg | ORAL_TABLET | Freq: Every day | ORAL | Status: DC
  Filled 2011-06-01: qty 1

## 2011-06-01 MED ORDER — ALBUTEROL SULFATE (5 MG/ML) 0.5% IN NEBU: 2.5000 mg | INHALATION_SOLUTION | RESPIRATORY_TRACT | Status: DC | PRN

## 2011-06-01 MED ORDER — FLUCONAZOLE 100 MG PO TABS
100.0000 mg | ORAL_TABLET | Freq: Every day | ORAL | Status: DC
  Administered 2011-06-01: 100 mg via ORAL
  Filled 2011-06-01 (×3): qty 1

## 2011-06-01 MED ORDER — AZITHROMYCIN 500 MG PO TABS
500.0000 mg | ORAL_TABLET | Freq: Every day | ORAL | Status: AC
  Administered 2011-06-01: 500 mg via ORAL
  Filled 2011-06-01: qty 1

## 2011-06-01 MED ORDER — IPRATROPIUM BROMIDE 0.02 % IN SOLN: 0.5000 mg | RESPIRATORY_TRACT | Status: DC | PRN

## 2011-06-01 MED ORDER — FLUCONAZOLE 100 MG PO TABS: 100.0000 mg | ORAL_TABLET | Freq: Every day | ORAL | Status: AC

## 2011-06-01 MED ORDER — AZITHROMYCIN 250 MG PO TABS: 250.0000 mg | ORAL_TABLET | Freq: Every day | ORAL | Status: AC

## 2011-06-01 MED ORDER — INSULIN GLARGINE 100 UNIT/ML ~~LOC~~ SOLN: 10.0000 [IU] | Freq: Every day | SUBCUTANEOUS | Status: DC

## 2011-06-01 MED ORDER — CEFUROXIME AXETIL 500 MG PO TABS
500.0000 mg | ORAL_TABLET | Freq: Two times a day (BID) | ORAL | Status: DC
  Administered 2011-06-01 – 2011-06-02 (×2): 500 mg via ORAL
  Filled 2011-06-01 (×3): qty 1

## 2011-06-01 MED ORDER — BENZONATATE 100 MG PO CAPS: 100.0000 mg | ORAL_CAPSULE | Freq: Three times a day (TID) | ORAL | Status: AC | PRN

## 2011-06-01 NOTE — Progress Notes (Signed)
CSW reviewed chart and Pt is from [facility]. Pt blind and silent.Did not communicate with CSW. CSW to reach out to son. Carlyon Prows and SNF to discuss d/c plans. Yellow note in chart. CSW to follow for return to SNF.  Vennie Homans, [place]  06/01/2011 10:52 AM For Ladene Artist, Theresia Majors [phone]

## 2011-06-01 NOTE — Progress Notes (Signed)
CSW spoke with the pts son and he reports the plan is for the pt to return to [facility] at discharge. CSW left voice mail for Donne Hazel at the facility to see if there are any concerns regarding the pt returning. CSW will continue to follow the pt and offer support.  Patrice Paradise, LCSWA 06/01/2011 12:09 PM [phone]

## 2011-06-01 NOTE — Progress Notes (Signed)
Called social worker at 1630 inquiring about discharge plan (pt return to facility) for patient. Left message and phone number for call back. Have not received phone call back related to patient discharge to facility.

## 2011-06-01 NOTE — Discharge Summary (Signed)
Discharge Summary  Adrienne Bailey MR#: [id number]  DOB:24-Aug-1923  Date of Admission: 05/27/2011 Date of Discharge: 06/01/2011  Patient's PCP: No primary provider on file.  Attending Physician:Brae Schaafsma A  Consults: None  Discharge Diagnoses: Principal Problem:  *Pneumonia Active Problems:  Hypertension  Dementia  CKD (chronic kidney disease) stage 3, GFR 30-59 ml/min  UTI (lower urinary tract infection)  Dysphagia  DM (diabetes mellitus)  Toxic encephalopathy  Brief Admitting History and Physical 78 year old African American female who is a nursing home resident presented on 05/28/2011 with fever and increasing confusion from baseline.  Discharge Medications Current Discharge Medication List    START taking these medications   Details  albuterol (PROVENTIL) (5 MG/ML) 0.5% nebulizer solution Take 0.5 mLs (2.5 mg total) by nebulization every 4 (four) hours as needed for wheezing or shortness of breath. Qty: 20 mL    azithromycin (ZITHROMAX) 250 MG tablet Take 1 tablet (250 mg total) by mouth daily. Discontinue after 06/05/2011. Qty: 6 each    benzonatate (TESSALON) 100 MG capsule Take 1 capsule (100 mg total) by mouth 3 (three) times daily as needed for cough. Qty: 20 capsule    cefUROXime (CEFTIN) 500 MG tablet Take 1 tablet (500 mg total) by mouth 2 (two) times daily with a meal. Discontinue after 06/05/2011    fluconazole (DIFLUCAN) 100 MG tablet Take 1 tablet (100 mg total) by mouth daily. Discontinue after 06/03/2011    ipratropium (ATROVENT) 0.02 % nebulizer solution Take 2.5 mLs (0.5 mg total) by nebulization every 4 (four) hours as needed. Qty: 75 mL      CONTINUE these medications which have CHANGED   Details  insulin glargine (LANTUS) 100 UNIT/ML injection Inject 10 Units into the skin at bedtime. Qty: 10 mL      CONTINUE these medications which have NOT CHANGED   Details  acetaminophen (TYLENOL) 325 MG tablet Take 650 mg by mouth 2 (two) times daily.  For pain      amLODipine (NORVASC) 10 MG tablet Take 10 mg by mouth daily.      aspirin 81 MG tablet Take 81 mg by mouth daily.      calcium-vitamin D (OSCAL WITH D) 500-200 MG-UNIT per tablet Take 1 tablet by mouth 3 (three) times daily.      Cranberry 405 MG CAPS Take 405 mg by mouth 2 (two) times daily.      escitalopram (LEXAPRO) 10 MG tablet Take 10 mg by mouth daily.      galantamine (RAZADYNE) 12 MG tablet Take 12 mg by mouth 2 (two) times daily.      insulin aspart (NOVOLOG) 100 UNIT/ML injection Inject 8 Units into the skin 3 (three) times daily before meals.      memantine (NAMENDA) 10 MG tablet Take 10 mg by mouth 2 (two) times daily.      prednisoLONE acetate (PRED FORTE) 1 % ophthalmic suspension Place 1 drop into the left eye daily after breakfast.      senna (SENOKOT) 8.6 MG tablet Take 1 tablet by mouth daily.          Hospital Course: 1. Pneumonia  Initally the patient was started on broad spectrum antibiotics consisting of Vancomycin and Zosyn. Blood cultures were obtained and are negative to date. Sputum cultures are growing yeast, and defined a 3 day course of fluconazole. The patient has a history of dementia/dysphagia and is from a SNF, so she was being treated for HAP. Given sputum culture only growing yeast, narrowed Zosyn to Rocephin, and  discontinue Vancomycin.  Added azithromycin to her course.  Antibiotics since 05/28/2011.  Antibiotics to be discontinued on 06/05/2011 to complete at least a 10 day course of antibiotics.  2. UTI (lower urinary tract infection)  Cultures growing E. Coli and Strep Viridans. Change Zosyn to Rocephin.  Escherichia coli is sensitive to cefazolin, ceftriaxone, nitrofurantoin, tobramycin.  At discharge transitioned ceftriaxone to cefuroxime mean for a total of 10 days.  3. Hypertension  Controlled on Amlodipine.   4.  Hypokalemia Replace when necessary.  5. Dementia  Continue pre-admission medications: galantamine,  memantine.   6. CKD (chronic kidney disease) stage 1 Creatinine improved over course of hospital stay. Baseline creatinine unknown.   7. Dysphagia  The patient was seen and evaluated by the speech therapist on 05/29/11. She was placed on a dysphagia 3 diet with thin liquids.   8. DM (diabetes mellitus)  The patient's hemoglobin A1c was checked and found to be 6.8% indicating good outpatient control. Her CBGs over the past 24 hours were 85-171.  At discharge patient's Lantus dose was decreased from 30 units to 10 units subcutaneous each bedtime.  Further titration of insulin to be done as an outpatient.   9. Toxic encephalopathy  Unclear baseline mental status, but is largely nonverbal at baseline.  Day of Discharge BP 124/83  Pulse 61  Temp(Src) 98.3 F (36.8 C) (Oral)  Resp 16  Ht 5\' 3"  (1.6 m)  Wt 66 kg (145 lb 8.1 oz)  BMI 25.77 kg/m2  SpO2 98%  Results for orders placed during the hospital encounter of 05/27/11 (from the past 48 hour(s))  GLUCOSE, CAPILLARY     Status: Normal   Collection Time   05/30/11  3:35 PM      Component Value Range Comment   Glucose-Capillary 87  70 - 99 (mg/dL)   GLUCOSE, CAPILLARY     Status: Normal   Collection Time   05/30/11  4:38 PM      Component Value Range Comment   Glucose-Capillary 90  70 - 99 (mg/dL)   GLUCOSE, CAPILLARY     Status: Normal   Collection Time   05/30/11  4:39 PM      Component Value Range Comment   Glucose-Capillary 91  70 - 99 (mg/dL)    Comment 1 Notify RN     GLUCOSE, CAPILLARY     Status: Normal   Collection Time   05/30/11 10:04 PM      Component Value Range Comment   Glucose-Capillary 92  70 - 99 (mg/dL)    Comment 1 Documented in Chart      Comment 2 Notify RN     CBC     Status: Normal   Collection Time   05/31/11  5:15 AM      Component Value Range Comment   WBC 5.9  4.0 - 10.5 (K/uL)    RBC 4.27  3.87 - 5.11 (MIL/uL)    Hemoglobin 12.8  12.0 - 15.0 (g/dL)    HCT 16.1  09.6 - 04.5 (%)    MCV 87.8  78.0 -  100.0 (fL)    MCH 30.0  26.0 - 34.0 (pg)    MCHC 34.1  30.0 - 36.0 (g/dL)    RDW 40.9  81.1 - 91.4 (%)    Platelets 242  150 - 400 (K/uL)   BASIC METABOLIC PANEL     Status: Abnormal   Collection Time   05/31/11  5:15 AM      Component Value Range  Comment   Sodium 142  135 - 145 (mEq/L)    Potassium 2.7 (*) 3.5 - 5.1 (mEq/L)    Chloride 108  96 - 112 (mEq/L)    CO2 24  19 - 32 (mEq/L)    Glucose, Bld 66 (*) 70 - 99 (mg/dL)    BUN 6  6 - 23 (mg/dL)    Creatinine, Ser 4.09  0.50 - 1.10 (mg/dL)    Calcium 8.9  8.4 - 10.5 (mg/dL)    GFR calc non Af Amer 80 (*) >90 (mL/min)    GFR calc Af Amer >90  >90 (mL/min)   MAGNESIUM     Status: Normal   Collection Time   05/31/11  5:15 AM      Component Value Range Comment   Magnesium 2.0  1.5 - 2.5 (mg/dL)   GLUCOSE, CAPILLARY     Status: Normal   Collection Time   05/31/11  7:51 AM      Component Value Range Comment   Glucose-Capillary 72  70 - 99 (mg/dL)    Comment 1 Notify RN     GLUCOSE, CAPILLARY     Status: Abnormal   Collection Time   05/31/11 11:41 AM      Component Value Range Comment   Glucose-Capillary 68 (*) 70 - 99 (mg/dL)   GLUCOSE, CAPILLARY     Status: Normal   Collection Time   05/31/11 12:37 PM      Component Value Range Comment   Glucose-Capillary 97  70 - 99 (mg/dL)   GLUCOSE, CAPILLARY     Status: Abnormal   Collection Time   05/31/11  6:21 PM      Component Value Range Comment   Glucose-Capillary 196 (*) 70 - 99 (mg/dL)    Comment 1 Notify RN     GLUCOSE, CAPILLARY     Status: Abnormal   Collection Time   05/31/11  9:53 PM      Component Value Range Comment   Glucose-Capillary 156 (*) 70 - 99 (mg/dL)    Comment 1 Notify RN     BASIC METABOLIC PANEL     Status: Abnormal   Collection Time   06/01/11  5:15 AM      Component Value Range Comment   Sodium 143  135 - 145 (mEq/L)    Potassium 3.7  3.5 - 5.1 (mEq/L) DELTA CHECK NOTED   Chloride 110  96 - 112 (mEq/L)    CO2 22  19 - 32 (mEq/L)    Glucose, Bld 90  70 - 99  (mg/dL)    BUN 6  6 - 23 (mg/dL)    Creatinine, Ser 8.11  0.50 - 1.10 (mg/dL)    Calcium 9.5  8.4 - 10.5 (mg/dL)    GFR calc non Af Amer 80 (*) >90 (mL/min)    GFR calc Af Amer >90  >90 (mL/min)   GLUCOSE, CAPILLARY     Status: Normal   Collection Time   06/01/11  7:33 AM      Component Value Range Comment   Glucose-Capillary 97  70 - 99 (mg/dL)   GLUCOSE, CAPILLARY     Status: Abnormal   Collection Time   06/01/11 12:06 PM      Component Value Range Comment   Glucose-Capillary 105 (*) 70 - 99 (mg/dL)    Comment 1 Notify RN       Dg Chest 2 View  05/27/2011  *RADIOLOGY REPORT*  Clinical Data: Fever and cough.  CHEST -  2 VIEW  Comparison: None  Findings: Left retrocardiac airspace disease/consolidation is noted suspicious for pneumonia. Cardiomegaly is identified in this low volume film. Mild peribronchial thickening is present. There is no evidence of pneumothorax or pleural effusion.  IMPRESSION: Left lower lung consolidation/airspace disease likely representing pneumonia.  Consider radiographic follow up to resolution.  Cardiomegaly.  Original Report Authenticated By: Rosendo Gros, M.D.   Ct Head Wo Contrast  05/28/2011  *RADIOLOGY REPORT*  Clinical Data: 78 year old female with altered mental status.  CT HEAD WITHOUT CONTRAST  Technique:  Contiguous axial images were obtained from the base of the skull through the vertex without contrast.  Comparison: 03/31/2006  Findings: Mild generalized cerebral volume loss is noted. Moderate chronic small vessel white matter ischemic changes are identified. 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  No acute intracranial abnormalities are identified, including mass lesion or mass effect, hydrocephalus, extra-axial fluid collection, midline shift, hemorrhage, or acute infarction.  The visualized bony calvarium is unremarkable. A hyperdense right globe versus prosthesis is again noted.  IMPRESSION: No evidence of acute intracranial abnormality.   Atrophy, chronic small vessel white matter ischemic changes and remote right cerebellar infarct.  Original Report Authenticated By: Rosendo Gros, M.D.   Disposition: Skilled nursing facility.  Diet: Dysphagia 3 diet with thin liquids  Activity: Resume as tolerated   Follow-up Appts: Discharge Orders    Future Orders Please Complete By Expires   Increase activity slowly      Discharge instructions      Comments:   Followup with PCP in 1 week.  Diet: Dysphagia 3 diet with thin liquids.      TESTS THAT NEED FOLLOW-UP None  Time spent on discharge, talking to the patient, and coordinating care: 35 mins.   Signed: Cristal Ford, MD 06/01/2011, 1:37 PM

## 2011-06-01 NOTE — Progress Notes (Signed)
Subjective: Patient sleepy and nonverbal.  Objective: Vital signs in last 24 hours: Filed Vitals:   05/31/11 0525 05/31/11 1434 05/31/11 2105 06/01/11 0550  BP: 146/57 137/76 120/78 124/83  Pulse: 78 77 75 61  Temp: 98.4 F (36.9 C) 98.1 F (36.7 C) 98 F (36.7 C) 98.3 F (36.8 C)  TempSrc: Oral Oral Oral Oral  Resp: 18 18 16 16   Height:      Weight:      SpO2: 98% 95% 96% 98%   Weight change:   Intake/Output Summary (Last 24 hours) at 06/01/11 1323 Last data filed at 06/01/11 0500  Gross per 24 hour  Intake    200 ml  Output    450 ml  Net   -250 ml    Physical Exam: General: Awake, Oriented, No acute distress. HEENT: EOMI. Neck: Supple CV: S1 and S2 Lungs: Clear to ascultation bilaterally Abdomen: Soft, Nontender, Nondistended, +bowel sounds.  Lab Results:  [facility] 06/01/11 0515 05/31/11 0515  NA 143 142  K 3.7 2.7*  CL 110 108  CO2 22 24  GLUCOSE 90 66*  BUN 6 6  CREATININE 0.61 0.62  CALCIUM 9.5 8.9  MG -- 2.0  PHOS -- --   No results found for this basename: AST:2,ALT:2,ALKPHOS:2,BILITOT:2,PROT:2,ALBUMIN:2 in the last 72 hours No results found for this basename: LIPASE:2,AMYLASE:2 in the last 72 hours  Basename 05/31/11 0515  WBC 5.9  NEUTROABS --  HGB 12.8  HCT 37.5  MCV 87.8  PLT 242   No results found for this basename: CKTOTAL:3,CKMB:3,CKMBINDEX:3,TROPONINI:3 in the last 72 hours No components found with this basename: POCBNP:3 No results found for this basename: DDIMER:2 in the last 72 hours No results found for this basename: HGBA1C:2 in the last 72 hours No results found for this basename: CHOL:2,HDL:2,LDLCALC:2,TRIG:2,CHOLHDL:2,LDLDIRECT:2 in the last 72 hours No results found for this basename: TSH,T4TOTAL,FREET3,T3FREE,THYROIDAB in the last 72 hours No results found for this basename: VITAMINB12:2,FOLATE:2,FERRITIN:2,TIBC:2,IRON:2,RETICCTPCT:2 in the last 72 hours  Micro Results: Recent Results (from the past 240 hour(s))    CULTURE, BLOOD (ROUTINE X 2)     Status: Normal (Preliminary result)   Collection Time   05/27/11 10:20 PM      Component Value Range Status Comment   Specimen Description BLOOD LEFT ANTERIOR FOREARM   Final    Special Requests BOTTLES DRAWN AEROBIC AND ANAEROBIC  5 CC EA   Final    Setup Time [id number]   Final    Culture     Final    Value:        BLOOD CULTURE RECEIVED NO GROWTH TO DATE CULTURE WILL BE HELD FOR 5 DAYS BEFORE ISSUING A FINAL NEGATIVE REPORT   Report Status PENDING   Incomplete   CULTURE, BLOOD (ROUTINE X 2)     Status: Normal (Preliminary result)   Collection Time   05/27/11 10:24 PM      Component Value Range Status Comment   Specimen Description BLOOD LEFT LATERAL FOREARM   Final    Special Requests BOTTLES DRAWN AEROBIC AND ANAEROBIC 5 CC EA   Final    Setup Time [id number]   Final    Culture     Final    Value:        BLOOD CULTURE RECEIVED NO GROWTH TO DATE CULTURE WILL BE HELD FOR 5 DAYS BEFORE ISSUING A FINAL NEGATIVE REPORT   Report Status PENDING   Incomplete   URINE CULTURE     Status: Normal   Collection Time  05/27/11 10:37 PM      Component Value Range Status Comment   Specimen Description URINE, CATHETERIZED   Final    Special Requests NONE   Final    Setup Time [id number]   Final    Colony Count >=100,000 COLONIES/ML   Final    Culture     Final    Value: ESCHERICHIA COLI     VIRIDANS STREPTOCOCCUS   Report Status 05/30/2011 FINAL   Final    Organism ID, Bacteria ESCHERICHIA COLI   Final   CULTURE, RESPIRATORY     Status: Normal   Collection Time   05/28/11 10:12 AM      Component Value Range Status Comment   Specimen Description SPUTUM   Final    Special Requests NONE   Final    Gram Stain     Final    Value: NO WBC SEEN     FEW SQUAMOUS EPITHELIAL CELLS PRESENT     NO ORGANISMS SEEN   Culture MODERATE CANDIDA ALBICANS   Final    Report Status 05/30/2011 FINAL   Final     Studies/Results: No results found.  Medications: I have  reviewed the patient's current medications. Scheduled Meds:    . acetaminophen  650 mg Oral BID  . amLODipine  10 mg Oral Daily  . aspirin  81 mg Oral Daily  . cefUROXime  500 mg Oral BID WC  . enoxaparin  40 mg Subcutaneous Q24H  . escitalopram  10 mg Oral Daily  . galantamine  12 mg Oral BID  . insulin aspart  0-15 Units Subcutaneous TID WC  . insulin glargine  10 Units Subcutaneous QHS  . memantine  10 mg Oral BID  . potassium chloride  10 mEq Intravenous Q1 Hr x 4  . potassium chloride  20 mEq Oral BID  . prednisoLONE acetate  1 drop Left Eye QPC breakfast  . DISCONTD: cefTRIAXone (ROCEPHIN)  IV  1 g Intravenous Q24H   Continuous Infusions:  PRN Meds:.acetaminophen, acetaminophen, albuterol, alum & mag hydroxide-simeth, benzonatate, ipratropium, ondansetron (ZOFRAN) IV, ondansetron  Assessment/Plan: 1. Pneumonia  Initally the patient was started on broad spectrum antibiotics consisting of Vancomycin and Zosyn. Blood cultures were obtained and are negative to date. Sputum cultures are growing yeast, will define a 3 day course of fluconazole. The patient has a history of dementia/dysphagia and is from a SNF, so she was being treated for HAP. Given sputum culture only growing yeast, narrowed Zosyn to Rocephin, and discontinue Vancomycin.  Antibiotics since 05/27/2010.  2. UTI (lower urinary tract infection)  Cultures growing E. Coli and Strep Viridans. Change Zosyn to Rocephin.  Escherichia coli is sensitive to cefazolin, ceftriaxone, nitrofurantoin, tobramycin.  Change ceftriaxone to cefuroxime mean for a total of 10 days.  3. Hypertension  Controlled on Amlodipine.   4.  Hypokalemia Replace when necessary.  5. Dementia  Continue pre-admission medications: galantamine, memantine.   6. CKD (chronic kidney disease) stage 1 Creatinine improved over course of hospital stay. Baseline creatinine unknown.   7. Dysphagia  The patient was seen and evaluated by the speech therapist  on 05/29/11. She was placed on a dysphagia 3 diet with thin liquids.   8. DM (diabetes mellitus)  The patient's hemoglobin A1c was checked and found to be 6.8% indicating good outpatient control. Her CBGs over the past 24 hours were 85-171. Continue Lantus, and moderate scale SSI.   9. Toxic encephalopathy  Unclear baseline mental status, but is largely nonverbal at  baseline.  10.  Disposition. Discharge to skilled nursing facility today.   LOS: 5 days  Jarryn Altland A, MD 06/01/2011, 1:23 PM\r

## 2011-06-02 LAB — GLUCOSE, CAPILLARY: Glucose-Capillary: 86 mg/dL (ref 70–99)

## 2011-06-02 NOTE — Progress Notes (Signed)
Per MD, Pt ready to d/c.  Per [facility] rep, facility ready to receive Pt.  Requested CSW fax d/c summary to [phone].  Faxed d/c summary.  Notified RN and Care Coordinator that facility ready for Pt.  Notified family.  Contacted EMS.  Pt to be d/c'd.  Providence Crosby, LCSWA Clinical Social Work [phone]

## 2011-06-02 NOTE — Progress Notes (Deleted)
Called patient husband and made him aware of patient's new condition and he stated he would come to the hospital soon and that he appreciated the call of patient's change of status. Notified patient of patient's condition and her new room number in ICU.

## 2011-06-02 NOTE — Progress Notes (Signed)
Pt assessment has not changed from am. Social Worker arranged transportation and d/c arrangements.

## 2011-06-02 NOTE — Progress Notes (Signed)
Patient discharged to skilled nursing facility today before I evaluated or saw the patient.  Patient's discharge was delayed yesterday.  Management as noted in discharge summary yesterday.

## 2012-11-08 ENCOUNTER — Non-Acute Institutional Stay (SKILLED_NURSING_FACILITY): Payer: Medicare Other | Admitting: Internal Medicine

## 2012-11-08 DIAGNOSIS — F0280 Dementia in other diseases classified elsewhere without behavioral disturbance: Secondary | ICD-10-CM

## 2012-11-08 DIAGNOSIS — E119 Type 2 diabetes mellitus without complications: Secondary | ICD-10-CM

## 2012-11-08 DIAGNOSIS — M159 Polyosteoarthritis, unspecified: Secondary | ICD-10-CM

## 2012-11-08 DIAGNOSIS — I10 Essential (primary) hypertension: Secondary | ICD-10-CM

## 2012-11-08 DIAGNOSIS — G309 Alzheimer's disease, unspecified: Secondary | ICD-10-CM

## 2012-11-10 DIAGNOSIS — F0280 Dementia in other diseases classified elsewhere without behavioral disturbance: Secondary | ICD-10-CM | POA: Insufficient documentation

## 2012-11-10 DIAGNOSIS — M159 Polyosteoarthritis, unspecified: Secondary | ICD-10-CM | POA: Insufficient documentation

## 2012-11-10 NOTE — Progress Notes (Signed)
PROGRESS NOTE  DATE: 11/08/2012  FACILITY: Nursing Home Location: [facility]  LEVEL OF CARE: SNF (31)  Routine Visit  CHIEF COMPLAINT:  Manage dementia and diabetes mellitus  HISTORY OF PRESENT ILLNESS:  REASSESSMENT OF ONGOING PROBLEM(S):  DEMENTIA: The dementia remaines stable and continues to function adequately in the current living environment with supervision.  The patient has had little changes in behavior. No complications noted from the medications presently being used. Patient is a poor historian. Dementia is advanced.  DM:pt's DM remains stable.  staff deny polyuria, polydipsia, polyphagia, changes in vision or hypoglycemic episodes.  No complications noted from the medication presently being used.  Last hemoglobin A1c is: 6.3 in 5/14.  PAST MEDICAL HISTORY : Reviewed.  No changes.  CURRENT MEDICATIONS: Reviewed per [facility]  REVIEW OF SYSTEMS: Unobtainable due to dementia  PHYSICAL EXAMINATION  VS:  T 98.6       P 82      RR 18      BP 136/70     POX %     WT (Lb)  GENERAL: no acute distress, normal body habitus NECK: supple, trachea midline, no neck masses, no thyroid tenderness, no thyromegaly RESPIRATORY: breathing is even & unlabored, BS CTAB CARDIAC: RRR, no murmur,no extra heart sounds, no edema GI: abdomen soft, normal BS, no masses, no tenderness, no hepatomegaly, no splenomegaly PSYCHIATRIC: the patient is alert & disoriented, affect & behavior appropriate  LABS/RADIOLOGY:  1/14 glucose 196 otherwise CMP normal, CBC normal  ASSESSMENT/PLAN:  Alzheimer's dementia-advanced. Diabetes mellitus-well controlled. Hypertension-well-controlled. Osteoarthritis-no pain. Constipation-continue current laxatives. Depression-stable.  CPT CODE: 11914

## 2012-12-06 ENCOUNTER — Non-Acute Institutional Stay (SKILLED_NURSING_FACILITY): Payer: Medicare Other | Admitting: Internal Medicine

## 2012-12-06 DIAGNOSIS — G309 Alzheimer's disease, unspecified: Secondary | ICD-10-CM

## 2012-12-06 DIAGNOSIS — F0280 Dementia in other diseases classified elsewhere without behavioral disturbance: Secondary | ICD-10-CM

## 2012-12-06 DIAGNOSIS — E119 Type 2 diabetes mellitus without complications: Secondary | ICD-10-CM

## 2012-12-06 DIAGNOSIS — I10 Essential (primary) hypertension: Secondary | ICD-10-CM

## 2012-12-06 DIAGNOSIS — M159 Polyosteoarthritis, unspecified: Secondary | ICD-10-CM

## 2012-12-13 NOTE — Progress Notes (Signed)
PROGRESS NOTE  DATE: 12-06-12  FACILITY: Nursing Home Location: [facility]  LEVEL OF CARE: SNF (31)  Routine Visit  CHIEF COMPLAINT:  Manage dementia and diabetes mellitus  HISTORY OF PRESENT ILLNESS:  REASSESSMENT OF ONGOING PROBLEM(S):  DEMENTIA: The dementia remaines stable and continues to function adequately in the current living environment with supervision.  The patient has had little changes in behavior. No complications noted from the medications presently being used. Patient is a poor historian. Dementia is advanced.  DM:pt's DM remains stable.  staff deny polyuria, polydipsia, polyphagia, changes in vision or hypoglycemic episodes.  No complications noted from the medication presently being used.  Last hemoglobin A1c is: 6.3 in 5/14.  PAST MEDICAL HISTORY : Reviewed.  No changes.  CURRENT MEDICATIONS: Reviewed per [facility]  REVIEW OF SYSTEMS: Unobtainable due to dementia  PHYSICAL EXAMINATION  VS:  T 97.1      P 82      RR 19      BP 111/62    POX %     WT (Lb)  GENERAL: no acute distress, normal body habitus NECK: supple, trachea midline, no neck masses, no thyroid tenderness, no thyromegaly RESPIRATORY: breathing is even & unlabored, BS CTAB CARDIAC: RRR, no murmur,no extra heart sounds, no edema GI: abdomen soft, normal BS, no masses, no tenderness, no hepatomegaly, no splenomegaly PSYCHIATRIC: the patient is alert & disoriented, affect & behavior appropriate  LABS/RADIOLOGY:  1/14 glucose 196 otherwise CMP normal, CBC normal  ASSESSMENT/PLAN:  Alzheimer's dementia-advanced. Diabetes mellitus-well controlled. Hypertension-well-controlled. Osteoarthritis-no pain. Constipation-continue current laxatives. Depression-stable. Check CBC and CMP  CPT CODE: 16109

## 2013-01-14 ENCOUNTER — Non-Acute Institutional Stay (SKILLED_NURSING_FACILITY): Payer: Medicare Other | Admitting: Adult Health

## 2013-01-14 ENCOUNTER — Encounter: Payer: Self-pay | Admitting: Adult Health

## 2013-01-14 DIAGNOSIS — I10 Essential (primary) hypertension: Secondary | ICD-10-CM

## 2013-01-14 DIAGNOSIS — E119 Type 2 diabetes mellitus without complications: Secondary | ICD-10-CM

## 2013-01-14 DIAGNOSIS — F329 Major depressive disorder, single episode, unspecified: Secondary | ICD-10-CM | POA: Insufficient documentation

## 2013-01-14 DIAGNOSIS — K5900 Constipation, unspecified: Secondary | ICD-10-CM

## 2013-01-14 DIAGNOSIS — F32A Depression, unspecified: Secondary | ICD-10-CM | POA: Insufficient documentation

## 2013-01-14 DIAGNOSIS — M159 Polyosteoarthritis, unspecified: Secondary | ICD-10-CM

## 2013-01-14 DIAGNOSIS — F0280 Dementia in other diseases classified elsewhere without behavioral disturbance: Secondary | ICD-10-CM

## 2013-01-14 NOTE — Progress Notes (Signed)
Patient ID: Adrienne Bailey, female   DOB: Oct 05, 1923, 79 y.o.   MRN: [id number]       PROGRESS NOTE  DATE: 01/14/2013  FACILITY: Nursing Home Location: [facility]  LEVEL OF CARE: SNF (31)  Routine Visit  CHIEF COMPLAINT:  Manage diabetes mellitus, depression, hypertension and Alzheimer's disease  HISTORY OF PRESENT ILLNESS:  REASSESSMENT OF ONGOING PROBLEM(S):  HTN: Pt 's HTN remains stable.  Denies CP, sob, DOE, pedal edema, headaches, dizziness or visual disturbances.  No complications from the medications currently being used.  Last BP : 136/74  DM:pt's DM remains stable.  Pt denies polyuria, polydipsia, polyphagia, changes in vision or hypoglycemic episodes.  No complications noted from the medication presently being used.   5/14  hgbA1c 6.3    DEPRESSION: The depression remains stable. Patient denies ongoing feelings of sadness, insomnia, anedhonia or lack of appetite. No complications reported from the medications currently being used. Staff do not report behavioral problems.   PAST MEDICAL HISTORY : Reviewed.  No changes.  CURRENT MEDICATIONS: Reviewed per [facility]  REVIEW OF SYSTEMS: unobtainable due to advanced alzheimer's disease   PHYSICAL EXAMINATION  VS:  T 98.6       P 66      RR 19      BP 136/74         WT 122.6 (Lb)  GENERAL: no acute distress, normal body habitus NECK: supple, trachea midline, no neck masses, no thyroid tenderness, no thyromegaly LYMPHATICS: no LAN in the neck, no supraclavicular LAN RESPIRATORY: breathing is even & unlabored, BS CTAB CARDIAC: RRR, no murmur,no extra heart sounds, no edema GI: abdomen soft, normal BS, no masses, no tenderness, no hepatomegaly, no splenomegaly PSYCHIATRIC: the patient is alert & disoriented, affect & behavior appropriate  LABS/RADIOLOGY: 7/14 WBC 5.3 hemoglobin 15.2 hematocrit 43.8 CMP normal 5/14 hemoglobin A1c 6.3 1/14 glucose 196 otherwise CMP normal, CBC  normal   ASSESSMENT/PLAN:  Alzheimer's dementia-advanced. Diabetes mellitus-well controlled. Hypertension-well-controlled. Osteoarthritis-appears comfortable Constipation-no complaints  Depression-stable.   CPT CODE: 40981

## 2013-02-10 ENCOUNTER — Non-Acute Institutional Stay (SKILLED_NURSING_FACILITY): Payer: Medicare Other | Admitting: Adult Health

## 2013-02-10 DIAGNOSIS — E119 Type 2 diabetes mellitus without complications: Secondary | ICD-10-CM

## 2013-02-10 DIAGNOSIS — M159 Polyosteoarthritis, unspecified: Secondary | ICD-10-CM

## 2013-02-10 DIAGNOSIS — F329 Major depressive disorder, single episode, unspecified: Secondary | ICD-10-CM

## 2013-02-10 DIAGNOSIS — F3289 Other specified depressive episodes: Secondary | ICD-10-CM

## 2013-02-10 DIAGNOSIS — K5900 Constipation, unspecified: Secondary | ICD-10-CM

## 2013-02-10 DIAGNOSIS — F0280 Dementia in other diseases classified elsewhere without behavioral disturbance: Secondary | ICD-10-CM

## 2013-02-10 DIAGNOSIS — I10 Essential (primary) hypertension: Secondary | ICD-10-CM

## 2013-02-10 NOTE — Progress Notes (Signed)
Patient ID: Adrienne Bailey, female   DOB: January 18, 1924, 79 y.o.   MRN: [id number]        PROGRESS NOTE  DATE: 02/10/13  FACILITY: Nursing Home Location: [facility]  LEVEL OF CARE: SNF (31)  Routine Visit  CHIEF COMPLAINT:  Manage diabetes mellitus, depression, hypertension and Alzheimer's disease  HISTORY OF PRESENT ILLNESS:  REASSESSMENT OF ONGOING PROBLEM(S):  HTN: Pt 's HTN remains stable.  Denies CP, sob, DOE, pedal edema, headaches, dizziness or visual disturbances.  No complications from the medications currently being used.  Last BP : 136/74  DEMENTIA: The dementia remaines stable and continues to function adequately in the current living environment with supervision.  The patient has had little changes in behavior. No complications noted from the medications presently being used.   CONSTIPATION: The constipation remains stable. No complications from the medications presently being used. Patient denies ongoing constipation, abdominal pain, nausea or vomiting.  PAST MEDICAL HISTORY : Reviewed.  No changes.  CURRENT MEDICATIONS: Reviewed per [facility]  REVIEW OF SYSTEMS: unobtainable due to advanced alzheimer's disease   PHYSICAL EXAMINATION  VS:  T 97.8      P 80     RR 18    BP 112/58        WT 122. 8 (Lb)  GENERAL: no acute distress, normal body habitus NOSE: no deformity; no discharge NECK: supple, trachea midline, no neck masses, no thyroid tenderness, no thyromegaly RESPIRATORY: breathing is even & unlabored, BS CTAB CARDIAC: RRR, no murmur,no extra heart sounds, no edema GI: abdomen soft, normal BS, no masses, no tenderness, no hepatomegaly, no splenomegaly PSYCHIATRIC: the patient is alert & disoriented, affect & behavior appropriate  LABS/RADIOLOGY: 7/14 WBC 5.3 hemoglobin 15.2 hematocrit 43.8 CMP normal 5/14 hemoglobin A1c 6.3 1/14 glucose 196 otherwise CMP normal, CBC normal   ASSESSMENT/PLAN:  Alzheimer's dementia-advanced. Diabetes  mellitus-well controlled. Hypertension-well-controlled. Osteoarthritis-appears comfortable Constipation-no complaints  Depression-stable.   CPT CODE: 40981

## 2013-02-12 ENCOUNTER — Encounter: Payer: Self-pay | Admitting: Adult Health

## 2013-03-19 ENCOUNTER — Non-Acute Institutional Stay (SKILLED_NURSING_FACILITY): Payer: Medicare Other | Admitting: Internal Medicine

## 2013-03-19 DIAGNOSIS — F0280 Dementia in other diseases classified elsewhere without behavioral disturbance: Secondary | ICD-10-CM

## 2013-03-19 DIAGNOSIS — E119 Type 2 diabetes mellitus without complications: Secondary | ICD-10-CM

## 2013-03-19 DIAGNOSIS — I10 Essential (primary) hypertension: Secondary | ICD-10-CM

## 2013-03-19 DIAGNOSIS — M159 Polyosteoarthritis, unspecified: Secondary | ICD-10-CM

## 2013-03-19 NOTE — Progress Notes (Signed)
PROGRESS NOTE  DATE: 03-19-13  FACILITY: Nursing Home Location: [facility]  LEVEL OF CARE: SNF (31)  Routine Visit  CHIEF COMPLAINT:  Manage dementia and diabetes mellitus  HISTORY OF PRESENT ILLNESS:  REASSESSMENT OF ONGOING PROBLEM(S):  DEMENTIA: The dementia remaines stable and continues to function adequately in the current living environment with supervision.  The patient has had little changes in behavior. No complications noted from the medications presently being used. Patient is a poor historian. Dementia is advanced.  DM:pt's DM remains stable.  staff deny polyuria, polydipsia, polyphagia, changes in vision or hypoglycemic episodes.  No complications noted from the medication presently being used.  Last hemoglobin A1c is: 6.3 in 5/14. In 9-14 hemoglobin A1c 7.2.  PAST MEDICAL HISTORY : Reviewed.  No changes.  CURRENT MEDICATIONS: Reviewed per [facility]  REVIEW OF SYSTEMS: Unobtainable due to dementia  PHYSICAL EXAMINATION  VS:  T 97.3      P 76      RR 18      BP 138/68    POX % 99    WT (Lb)  GENERAL: no acute distress, normal body habitus NECK: supple, trachea midline, no neck masses, no thyroid tenderness, no thyromegaly RESPIRATORY: breathing is even & unlabored, BS CTAB CARDIAC: RRR, no murmur,no extra heart sounds, no edema GI: abdomen soft, normal BS, no masses, no tenderness, no hepatomegaly, no splenomegaly PSYCHIATRIC: the patient is alert & disoriented, affect & behavior appropriate  LABS/RADIOLOGY:  7-14 CBC and CMP normal  1/14 glucose 196 otherwise CMP normal, CBC normal  ASSESSMENT/PLAN:  Alzheimer's dementia-advanced. Diabetes mellitus-Lantus was decreased due to hypoglycemia Hypertension-well-controlled. Osteoarthritis-no pain. Constipation-continue current laxatives. Depression-stable. Conjunctivitis-on erythromycin ointment  CPT CODE: 16109

## 2013-04-16 ENCOUNTER — Non-Acute Institutional Stay (SKILLED_NURSING_FACILITY): Payer: Medicare Other | Admitting: Internal Medicine

## 2013-04-16 ENCOUNTER — Encounter: Payer: Self-pay | Admitting: Internal Medicine

## 2013-04-16 DIAGNOSIS — F0280 Dementia in other diseases classified elsewhere without behavioral disturbance: Secondary | ICD-10-CM

## 2013-04-16 DIAGNOSIS — M159 Polyosteoarthritis, unspecified: Secondary | ICD-10-CM

## 2013-04-16 DIAGNOSIS — I10 Essential (primary) hypertension: Secondary | ICD-10-CM

## 2013-04-16 DIAGNOSIS — E119 Type 2 diabetes mellitus without complications: Secondary | ICD-10-CM

## 2013-04-16 NOTE — Progress Notes (Signed)
PROGRESS NOTE  DATE: 04-16-13  FACILITY: Nursing Home Location: [facility]  LEVEL OF CARE: SNF (31)  Routine Visit  CHIEF COMPLAINT:  Manage dementia and diabetes mellitus  HISTORY OF PRESENT ILLNESS:  REASSESSMENT OF ONGOING PROBLEM(S):  DEMENTIA: The dementia remaines stable and continues to function adequately in the current living environment with supervision.  The patient has had little changes in behavior. No complications noted from the medications presently being used. Patient is a poor historian. Dementia is advanced.  DM:pt's DM remains stable.  staff deny polyuria, polydipsia, polyphagia, changes in vision or hypoglycemic episodes.  No complications noted from the medication presently being used.  Last hemoglobin A1c is: 6.3 in 5/14. In 9-14 hemoglobin A1c 7.2.  PAST MEDICAL HISTORY : Reviewed.  No changes.  CURRENT MEDICATIONS: Reviewed per [facility]  REVIEW OF SYSTEMS: Unobtainable due to dementia  PHYSICAL EXAMINATION  VS:  T 96.7      P 81      RR 19      BP 126/71    POX % 98    WT (Lb)  GENERAL: no acute distress, normal body habitus NECK: supple, trachea midline, no neck masses, no thyroid tenderness, no thyromegaly RESPIRATORY: breathing is even & unlabored, BS CTAB CARDIAC: RRR, no murmur,no extra heart sounds, no edema GI: abdomen soft, normal BS, no masses, no tenderness, no hepatomegaly, no splenomegaly PSYCHIATRIC: the patient is alert & disoriented, affect & behavior appropriate  LABS/RADIOLOGY:  7-14 CBC and CMP normal  1/14 glucose 196 otherwise CMP normal, CBC normal  ASSESSMENT/PLAN:  Alzheimer's dementia-advanced. Diabetes mellitus-Lantus was decreased due to hypoglycemia Hypertension-well-controlled. Osteoarthritis-no pain. Constipation-continue current laxatives. Depression-stable.  CPT CODE: 16109

## 2013-06-19 ENCOUNTER — Non-Acute Institutional Stay (SKILLED_NURSING_FACILITY): Payer: Medicare Other | Admitting: Adult Health

## 2013-06-19 DIAGNOSIS — G309 Alzheimer's disease, unspecified: Secondary | ICD-10-CM

## 2013-06-19 DIAGNOSIS — F3289 Other specified depressive episodes: Secondary | ICD-10-CM

## 2013-06-19 DIAGNOSIS — K5900 Constipation, unspecified: Secondary | ICD-10-CM

## 2013-06-19 DIAGNOSIS — I10 Essential (primary) hypertension: Secondary | ICD-10-CM

## 2013-06-19 DIAGNOSIS — F329 Major depressive disorder, single episode, unspecified: Secondary | ICD-10-CM

## 2013-06-19 DIAGNOSIS — F0280 Dementia in other diseases classified elsewhere without behavioral disturbance: Secondary | ICD-10-CM

## 2013-06-19 DIAGNOSIS — E119 Type 2 diabetes mellitus without complications: Secondary | ICD-10-CM

## 2013-06-19 DIAGNOSIS — M159 Polyosteoarthritis, unspecified: Secondary | ICD-10-CM

## 2013-06-19 DIAGNOSIS — F32A Depression, unspecified: Secondary | ICD-10-CM

## 2013-06-19 NOTE — Progress Notes (Signed)
Patient ID: Adrienne Bailey, female   DOB: 1923/07/23, 80 y.o.   MRN: [id number]         PROGRESS NOTE  DATE: 06/19/13  FACILITY: Nursing Home Location: [facility]  LEVEL OF CARE: SNF (31)  Routine Visit  CHIEF COMPLAINT:  Manage dementia and diabetes mellitus  HISTORY OF PRESENT ILLNESS:  REASSESSMENT OF ONGOING PROBLEM(S):  HTN: Pt 's HTN remains stable.  Denies CP, sob, DOE, pedal edema, headaches, dizziness or visual disturbances.  No complications from the medications currently being used.  Last BP :126/78  DEPRESSION: The depression remains stable. Patient denies ongoing feelings of sadness, insomnia, anedhonia or lack of appetite. No complications reported from the medications currently being used. Staff do not report behavioral problems.  DM:pt's DM remains stable.  staff deny polyuria, polydipsia, polyphagia, changes in vision or hypoglycemic episodes.  No complications noted from the medication presently being used.   11/14 hgbA1c 8.2   PAST MEDICAL HISTORY : Reviewed.  No changes.  [facility]  REVIEW OF SYSTEMS: Unobtainable due to dementia  PHYSICAL EXAMINATION  VS:  T 96.7      P 81      RR 18     BP 126/78    POX % 98    WT117 (Lb)  GENERAL: no acute distress, normal body habitus NECK: supple, trachea midline, no neck masses, no thyroid tenderness, no thyromegaly LYMPHATIC: No adenopathy in the cervical, supraclavicular, axillary, or inguinal areas RESPIRATORY: breathing is even & unlabored, BS CTAB CARDIAC: RRR, no murmur,no extra heart sounds, no edema GI: abdomen soft, normal BS, no masses, no tenderness, no hepatomegaly, no splenomegaly PSYCHIATRIC: the patient is alert & disoriented, affect & behavior appropriate  LABS/RADIOLOGY: 04/11/13 sodium 140 potassium 4.0 glucose 65 BUN 9 creatinine 0.6 calcium 9.7 albumin 3.7 total protein 7.8 AST 14 ALT 8 WBC 7.1 hemoglobin 13.8 hematocrit 43.7 hemoglobin A1c  8.2 02/13/13 hemoglobin A1c 7.2 7-14 CBC and CMP normal 1/14 glucose 196 otherwise CMP normal, CBC normal  ASSESSMENT/PLAN:  Alzheimer's dementia-advanced. Diabetes mellitus- well-controlled Hypertension-well-controlled. Osteoarthritis-no pain. Constipation-continue Senna-S Depression-stable; continue Lexapro   CPT CODE: [id number]

## 2013-07-18 NOTE — Progress Notes (Signed)
This encounter was created in error - please disregard.

## 2013-07-22 ENCOUNTER — Encounter: Payer: Self-pay | Admitting: *Deleted

## 2013-09-05 ENCOUNTER — Encounter: Payer: Self-pay | Admitting: Adult Health

## 2013-09-05 ENCOUNTER — Non-Acute Institutional Stay (SKILLED_NURSING_FACILITY): Payer: Medicare Other | Admitting: Adult Health

## 2013-09-05 DIAGNOSIS — E119 Type 2 diabetes mellitus without complications: Secondary | ICD-10-CM

## 2013-09-05 DIAGNOSIS — G309 Alzheimer's disease, unspecified: Secondary | ICD-10-CM

## 2013-09-05 DIAGNOSIS — F3289 Other specified depressive episodes: Secondary | ICD-10-CM

## 2013-09-05 DIAGNOSIS — M159 Polyosteoarthritis, unspecified: Secondary | ICD-10-CM

## 2013-09-05 DIAGNOSIS — F32A Depression, unspecified: Secondary | ICD-10-CM

## 2013-09-05 DIAGNOSIS — F329 Major depressive disorder, single episode, unspecified: Secondary | ICD-10-CM

## 2013-09-05 DIAGNOSIS — I10 Essential (primary) hypertension: Secondary | ICD-10-CM

## 2013-09-05 DIAGNOSIS — F0280 Dementia in other diseases classified elsewhere without behavioral disturbance: Secondary | ICD-10-CM

## 2013-09-05 DIAGNOSIS — K5900 Constipation, unspecified: Secondary | ICD-10-CM

## 2013-09-05 NOTE — Progress Notes (Signed)
Patient ID: Adrienne Bailey, female   DOB: Dec 28, 1923, 80 y.o.   MRN: [id number]         PROGRESS NOTE  DATE: 09/05/13  [facility]: Nursing Home [facility]  LEVEL OF CARE: SNF (31)  Routine Visit  CHIEF COMPLAINT:  Manage dementia, depression and diabetes mellitus  HISTORY OF PRESENT ILLNESS:  REASSESSMENT OF ONGOING PROBLEM(S):  HTN: Pt 's HTN remains stable.  Denies CP, sob, DOE, pedal edema, headaches, dizziness or visual disturbances.  No complications from the medications currently being used.  Last BP :114/63  DEMENTIA: The dementia remaines stable and continues to function adequately in the current living environment with supervision.  The patient has had little changes in behavior. No complications noted from the medications presently being used.  DM:pt's DM remains stable.  staff deny polyuria, polydipsia, polyphagia, changes in vision or hypoglycemic episodes.  No complications noted from the medication presently being used.   2/15  hgbA1c 8.2   PAST MEDICAL HISTORY : Reviewed.  No changes.  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 NECK: supple, trachea midline, no neck masses, no thyroid tenderness, no thyromegaly LYMPHATIC: No adenopathy in the cervical, supraclavicular, axillary, or inguinal areas RESPIRATORY: breathing is even & unlabored, BS CTAB CARDIAC: RRR, no murmur,no extra heart sounds, no edema GI: abdomen soft, normal BS, no masses, no tenderness, no hepatomegaly, no splenomegaly PSYCHIATRIC: the patient is alert & disoriented, affect & behavior appropriate  LABS/RADIOLOGY: 07/14/13 hgbA1c 8.2 04/11/13 sodium 140 potassium 4.0 glucose 65 BUN 9 creatinine 0.6 calcium 9.7 albumin 3.7 total protein 7.8 AST 14 ALT 8 WBC 7.1 hemoglobin 13.8 hematocrit 43.7 hemoglobin A1c 8.2 02/13/13  hemoglobin A1c 7.2 7-14 CBC and CMP normal 1/14 glucose 196 otherwise CMP normal, CBC normal  ASSESSMENT/PLAN:  Alzheimer's dementia-advanced; recently increased Namenda dosage and continue Galantamine Diabetes mellitus- well-controlled; continue NovoLog and Lantus Hypertension-well-controlled; continue Norvasc Osteoarthritis-no pain; continue acetaminophen Constipation-continue Senna-S Depression-stable; continue Lexapro   CPT CODE: [id number]   Adrienne BodoMonina Bailey - NP  [facility] [phone]

## 2013-09-26 ENCOUNTER — Encounter: Payer: Self-pay | Admitting: *Deleted

## 2013-10-07 ENCOUNTER — Non-Acute Institutional Stay (SKILLED_NURSING_FACILITY): Payer: Medicare Other | Admitting: Adult Health

## 2013-10-07 ENCOUNTER — Encounter: Payer: Self-pay | Admitting: Adult Health

## 2013-10-07 DIAGNOSIS — F3289 Other specified depressive episodes: Secondary | ICD-10-CM

## 2013-10-07 DIAGNOSIS — F32A Depression, unspecified: Secondary | ICD-10-CM

## 2013-10-07 DIAGNOSIS — F329 Major depressive disorder, single episode, unspecified: Secondary | ICD-10-CM

## 2013-10-07 DIAGNOSIS — K5900 Constipation, unspecified: Secondary | ICD-10-CM

## 2013-10-07 DIAGNOSIS — G309 Alzheimer's disease, unspecified: Secondary | ICD-10-CM

## 2013-10-07 DIAGNOSIS — F0280 Dementia in other diseases classified elsewhere without behavioral disturbance: Secondary | ICD-10-CM

## 2013-10-07 DIAGNOSIS — I10 Essential (primary) hypertension: Secondary | ICD-10-CM

## 2013-10-07 DIAGNOSIS — E119 Type 2 diabetes mellitus without complications: Secondary | ICD-10-CM

## 2013-10-07 DIAGNOSIS — M159 Polyosteoarthritis, unspecified: Secondary | ICD-10-CM

## 2013-10-07 NOTE — Progress Notes (Signed)
Patient ID: Adrienne Bailey, female   DOB: [birth year], 80 y.o.   MRN: [id number]          PROGRESS NOTE  DATE: 10/07/13  FAC[facility]: Nursing Home [facility]  LEVEL OF CARE: SNF (31)  Routine Visit  CHIEF COMPLAINT:  Manage dementia, depression, hypertension and diabetes mellitus  HISTORY OF PRESENT ILLNESS:  REASSESSMENT OF ONGOING PROBLEM(S):  HTN: Pt 's HTN remains stable.  Denies CP, sob, DOE, pedal edema, headaches, dizziness or visual disturbances.  No complications from the medications currently being used.  Last BP :114/63  DEPRESSION: The depression remains stable. Patient denies ongoing feelings of sadness, insomnia, anedhonia or lack of appetite. No complications reported from the medications currently being used. Staff do not report behavioral problems.  CONSTIPATION: The constipation remains stable. No complications from the medications presently being used. Patient denies ongoing constipation, abdominal pain, nausea or vomiting.  PAST MEDICAL HISTORY : Reviewed.  No changes.  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 NECK: supple, trachea midline, no neck masses, no thyroid tenderness, no thyromegaly LYMPHATIC: No adenopathy in the cervical, supraclavicular, axillary, or inguinal areas RESPIRATORY: breathing is even & unlabored, BS CTAB CARDIAC: RRR, no murmur,no extra heart sounds, no edema GI: abdomen soft, normal BS, no masses, no tenderness, no hepatomegaly, no splenomegaly EXTREMITIES: able to move BUE and uses wheelchair; total assistance with ADLs PSYCHIATRIC: the patient is alert & disoriented, affect & behavior appropriate  LABS/RADIOLOGY: 07/14/13 hgbA1c 8.2 04/11/13 sodium 140 potassium 4.0 glucose 65 BUN 9 creatinine 0.6 calcium 9.7 albumin 3.7 total protein 7.8 AST 14 ALT 8 WBC 7.1  hemoglobin 13.8 hematocrit 43.7 hemoglobin A1c 8.2 02/13/13 hemoglobin A1c 7.2 7-14 CBC and CMP normal 1/14 glucose 196 otherwise CMP normal, CBC normal  ASSESSMENT/PLAN:  Alzheimer's dementia-advanced; recently increased Namenda dosage and continue Galantamine Diabetes mellitus- well-controlled; continue NovoLog and Lantus Hypertension-well-controlled; continue Norvasc Osteoarthritis-no pain; continue acetaminophen Constipation-continue Senna-S Depression-stable; continue Lexapro   CPT CODE: [id number]   Ella BodoMonina Vargas - NP  [facility] [phone]

## 2013-10-23 ENCOUNTER — Encounter: Payer: Self-pay | Admitting: *Deleted

## 2013-10-30 ENCOUNTER — Non-Acute Institutional Stay (SKILLED_NURSING_FACILITY): Payer: Medicare Other | Admitting: Internal Medicine

## 2013-10-30 DIAGNOSIS — F0280 Dementia in other diseases classified elsewhere without behavioral disturbance: Secondary | ICD-10-CM

## 2013-10-30 DIAGNOSIS — E119 Type 2 diabetes mellitus without complications: Secondary | ICD-10-CM | POA: Insufficient documentation

## 2013-10-30 DIAGNOSIS — I10 Essential (primary) hypertension: Secondary | ICD-10-CM

## 2013-10-30 DIAGNOSIS — M159 Polyosteoarthritis, unspecified: Secondary | ICD-10-CM

## 2013-10-30 DIAGNOSIS — E1165 Type 2 diabetes mellitus with hyperglycemia: Secondary | ICD-10-CM

## 2013-10-30 DIAGNOSIS — G309 Alzheimer's disease, unspecified: Principal | ICD-10-CM

## 2013-10-30 DIAGNOSIS — IMO0001 Reserved for inherently not codable concepts without codable children: Secondary | ICD-10-CM

## 2013-10-30 NOTE — Progress Notes (Signed)
        PROGRESS NOTE  DATE: 10-30-13  FACILITY: Nursing Home Location: [facility]  LEVEL OF CARE: SNF (31)  Routine Visit  CHIEF COMPLAINT:  Manage dementia, hypertension and diabetes mellitus  HISTORY OF PRESENT ILLNESS:  REASSESSMENT OF ONGOING PROBLEM(S):  DEMENTIA: The dementia remaines stable and continues to function adequately in the current living environment with supervision.  The patient has had little changes in behavior. No complications noted from the medications presently being used. Patient is a poor historian. Dementia is advanced.  DM:pt's DM remains stable.  staff deny polyuria, polydipsia, polyphagia, changes in vision or hypoglycemic episodes.  No complications noted from the medication presently being used.  Last hemoglobin A1c is: 6.3 in 5/14. In 9-14 hemoglobin A1c 7.2, in 2-15 hemoglobin A1c 8.2.  HTN: Pt 's HTN remains stable.  Staff Deny CP, sob, DOE, pedal edema, headaches, dizziness or visual disturbances.  No complications from the medications currently being used.  Last BP : 109/68  PAST MEDICAL HISTORY : Reviewed.  No changes.  CURRENT MEDICATIONS: Reviewed per [facility]  REVIEW OF SYSTEMS: Unobtainable due to dementia  PHYSICAL EXAMINATION  VS: see vital sign section  GENERAL: no acute distress, normal body habitus EYES: Unable to assess NECK: supple, trachea midline, no neck masses, no thyroid tenderness, no thyromegaly LYMPHATICS: No cervical lymphadenopathy, no supraclavicular lymphadenopathy RESPIRATORY: breathing is even & unlabored, BS CTAB CARDIAC: RRR, no murmur,no extra heart sounds, no edema GI: abdomen soft, normal BS, no masses, no tenderness, no hepatomegaly, no splenomegaly PSYCHIATRIC: the patient is alert & disoriented, affect & behavior appropriate  LABS/RADIOLOGY: 5-15 CBC and CMP normal 7-14 CBC and CMP normal  1/14 glucose 196 otherwise CMP normal, CBC normal  ASSESSMENT/PLAN:  Alzheimer's  dementia-advanced. Diabetes mellitus -will not tighten control due to hypoglycemia Hypertension-well-controlled. Osteoarthritis-appears comfortable Constipation-continue current laxatives. Depression-stable.  CPT CODE: 53664  Newton Pigg. Kerry Dory, MD [facility] [phone]

## 2013-12-02 ENCOUNTER — Non-Acute Institutional Stay (SKILLED_NURSING_FACILITY): Payer: Medicare Other | Admitting: Adult Health

## 2013-12-02 ENCOUNTER — Encounter: Payer: Self-pay | Admitting: Adult Health

## 2013-12-02 DIAGNOSIS — F32A Depression, unspecified: Secondary | ICD-10-CM

## 2013-12-02 DIAGNOSIS — K5900 Constipation, unspecified: Secondary | ICD-10-CM

## 2013-12-02 DIAGNOSIS — F0280 Dementia in other diseases classified elsewhere without behavioral disturbance: Secondary | ICD-10-CM

## 2013-12-02 DIAGNOSIS — M159 Polyosteoarthritis, unspecified: Secondary | ICD-10-CM

## 2013-12-02 DIAGNOSIS — I10 Essential (primary) hypertension: Secondary | ICD-10-CM

## 2013-12-02 DIAGNOSIS — G309 Alzheimer's disease, unspecified: Principal | ICD-10-CM

## 2013-12-02 DIAGNOSIS — E1165 Type 2 diabetes mellitus with hyperglycemia: Secondary | ICD-10-CM

## 2013-12-02 DIAGNOSIS — F329 Major depressive disorder, single episode, unspecified: Secondary | ICD-10-CM

## 2013-12-02 DIAGNOSIS — IMO0001 Reserved for inherently not codable concepts without codable children: Secondary | ICD-10-CM

## 2013-12-02 DIAGNOSIS — F3289 Other specified depressive episodes: Secondary | ICD-10-CM

## 2013-12-02 NOTE — Progress Notes (Signed)
Patient ID: Adrienne Bailey, female   DOB: 19-Sep-1923, 80 y.o.   MRN: [id number]        PROGRESS NOTE  DATE:   12/02/13  FAC[facility]: Nursing Home [facility]  LEVEL OF CARE: SNF (31)  Routine Visit  CHIEF COMPLAINT:  Manage dementia, hypertension and diabetes mellitus  HISTORY OF PRESENT ILLNESS:  REASSESSMENT OF ONGOING PROBLEM(S):   DM:pt's DM remains stable.  staff deny polyuria, polydipsia, polyphagia, changes in vision or hypoglycemic episodes.  No complications noted from the medication presently being used.  5/15  hemoglobin A1c 8.0  HTN: Pt 's HTN remains stable.  Staff Deny CP, sob, DOE, pedal edema, headaches, dizziness or visual disturbances.  No complications from the medications currently being used.  Last BP : 127/66  DEPRESSION: The depression remains stable. Patient denies ongoing feelings of sadness, insomnia, anedhonia or lack of appetite. No complications reported from the medications currently being used. Staff do not report behavioral problems.  PAST MEDICAL HISTORY : Reviewed.  No changes.  CURRENT MEDICATIONS: [facility]  REVIEW OF SYSTEMS: Unobtainable due to dementia  PHYSICAL EXAMINATION  GENERAL: no acute distress, normal body habitus NECK: supple, trachea midline, no neck masses, no thyroid tenderness, no thyromegaly LYMPHATICS: No cervical lymphadenopathy, no supraclavicular lymphadenopathy RESPIRATORY: breathing is even & unlabored, BS CTAB CARDIAC: RRR, no murmur,no extra heart sounds, no edema GI: abdomen soft, normal BS, no masses, no tenderness, no hepatomegaly, no splenomegaly PSYCHIATRIC: the patient is alert & disoriented, affect & behavior appropriate  LABS/RADIOLOGY: 5-15 CBC and CMP normal  hgbA1c8.0 7-14 CBC and CMP normal 1/14 glucose 196 otherwise CMP normal, CBC normal  ASSESSMENT/PLAN:  Alzheimer's dementia-advanced; continue Namenda and Galantamine Diabetes mellitus -well controlled; continue  NovoLog and Lantus Hypertension-well-controlled; continue Norvasc Osteoarthritis-appears comfortable; continue acetaminophen Constipation-continue Senokot S. Depression-stable; continue Lexapro   CPT CODE: [id number]  Ella BodoMonina Vargas - NP [facility] [phone]

## 2013-12-23 ENCOUNTER — Encounter: Payer: Self-pay | Admitting: Adult Health

## 2013-12-23 ENCOUNTER — Non-Acute Institutional Stay (SKILLED_NURSING_FACILITY): Payer: Medicare Other | Admitting: Adult Health

## 2013-12-23 DIAGNOSIS — G309 Alzheimer's disease, unspecified: Principal | ICD-10-CM

## 2013-12-23 DIAGNOSIS — K5900 Constipation, unspecified: Secondary | ICD-10-CM

## 2013-12-23 DIAGNOSIS — F0280 Dementia in other diseases classified elsewhere without behavioral disturbance: Secondary | ICD-10-CM

## 2013-12-23 DIAGNOSIS — F329 Major depressive disorder, single episode, unspecified: Secondary | ICD-10-CM

## 2013-12-23 DIAGNOSIS — F32A Depression, unspecified: Secondary | ICD-10-CM

## 2013-12-23 DIAGNOSIS — M159 Polyosteoarthritis, unspecified: Secondary | ICD-10-CM

## 2013-12-23 DIAGNOSIS — F3289 Other specified depressive episodes: Secondary | ICD-10-CM

## 2013-12-23 DIAGNOSIS — E1165 Type 2 diabetes mellitus with hyperglycemia: Secondary | ICD-10-CM

## 2013-12-23 DIAGNOSIS — I10 Essential (primary) hypertension: Secondary | ICD-10-CM

## 2013-12-23 DIAGNOSIS — IMO0001 Reserved for inherently not codable concepts without codable children: Secondary | ICD-10-CM

## 2013-12-23 NOTE — Progress Notes (Signed)
Patient ID: Adrienne Bailey, female   DOB: September 10, 1923, 80 y.o.   MRN: [id number]        PROGRESS NOTE  DATE:     12/23/13  [facility]: Nursing Home [facility]  LEVEL OF CARE: SNF (31)  Routine Visit  CHIEF COMPLAINT:  Manage dementia, hypertension and diabetes mellitus  HISTORY OF PRESENT ILLNESS:  REASSESSMENT OF ONGOING PROBLEM(S):  DEMENTIA: The dementia remaines stable and continues to function adequately in the current living environment with supervision.  The patient has had little changes in behavior. No complications noted from the medications presently being used.  HTN: Pt 's HTN remains stable.  Staff Deny CP, sob, DOE, pedal edema, headaches, dizziness or visual disturbances.  No complications from the medications currently being used.  Last BP : 142/76  CONSTIPATION: The constipation remains stable. No complications from the medications presently being used. Patient denies ongoing constipation, abdominal pain, nausea or vomiting.  PAST MEDICAL HISTORY : Reviewed.  No changes.  CURRENT MEDICATIONS: [facility]  REVIEW OF SYSTEMS: Unobtainable due to dementia  PHYSICAL EXAMINATION  GENERAL: no acute distress, normal body habitus NECK: supple, trachea midline, no neck masses, no thyroid tenderness, no thyromegaly RESPIRATORY: breathing is even & unlabored, BS CTAB CARDIAC: RRR, no murmur,no extra heart sounds, no edema GI: abdomen soft, normal BS, no masses, no tenderness, no hepatomegaly, no splenomegaly PSYCHIATRIC: the patient is alert & disoriented, affect & behavior appropriate  LABS/RADIOLOGY: 11/12/13  hemoglobin A1c 8.0 5-15 CBC and CMP normal   7-14 CBC and CMP normal 1/14 glucose 196 otherwise CMP normal, CBC normal  ASSESSMENT/PLAN:  Alzheimer's dementia  -  advanced; continue Namenda and Galantamine Diabetes mellitus   -   continue NovoLog and Lantus Hypertension  -  well-controlled; continue Norvasc Osteoarthritis  -   appears comfortable; continue acetaminophen Constipation  -  continue Senokot S. Depression  -  stable; recently discontinued Lexapro   CPT CODE: [id number]  Ella BodoMonina Vargas - NP [facility] [phone]

## 2014-01-29 ENCOUNTER — Non-Acute Institutional Stay (SKILLED_NURSING_FACILITY): Payer: Medicare Other | Admitting: Adult Health

## 2014-01-29 ENCOUNTER — Encounter: Payer: Self-pay | Admitting: Adult Health

## 2014-01-29 DIAGNOSIS — I10 Essential (primary) hypertension: Secondary | ICD-10-CM

## 2014-01-29 DIAGNOSIS — IMO0001 Reserved for inherently not codable concepts without codable children: Secondary | ICD-10-CM

## 2014-01-29 DIAGNOSIS — G309 Alzheimer's disease, unspecified: Secondary | ICD-10-CM

## 2014-01-29 DIAGNOSIS — E1165 Type 2 diabetes mellitus with hyperglycemia: Principal | ICD-10-CM

## 2014-01-29 DIAGNOSIS — M159 Polyosteoarthritis, unspecified: Secondary | ICD-10-CM

## 2014-01-29 DIAGNOSIS — K5900 Constipation, unspecified: Secondary | ICD-10-CM

## 2014-01-29 DIAGNOSIS — F0280 Dementia in other diseases classified elsewhere without behavioral disturbance: Secondary | ICD-10-CM

## 2014-01-29 NOTE — Progress Notes (Signed)
Patient ID: Adrienne Bailey, female   DOB: 04-16-1924, 80 y.o.   MRN: [id number]        PROGRESS NOTE  DATE:       01/29/14  FACILITY: Nursing Home [facility]  LEVEL OF CARE: SNF (31)  Routine Visit  CHIEF COMPLAINT:  Manage dementia, hypertension and diabetes mellitus  HISTORY OF PRESENT ILLNESS:  REASSESSMENT OF ONGOING PROBLEM(S):  DM:pt's DM remains stable.  Pt denies polyuria, polydipsia, polyphagia, changes in vision or hypoglycemic episodes.  No complications noted from the medication presently being used.   6/15 hemoglobin A1c is: 8.0  DEMENTIA: The dementia remaines stable and continues to function adequately in the current living environment with supervision.  The patient has had little changes in behavior. No complications noted from the medications presently being used.  HTN: Pt 's HTN remains stable.  Staff Deny CP, sob, DOE, pedal edema, headaches, dizziness or visual disturbances.  No complications from the medications currently being used.  Last BP : 121/70   PAST MEDICAL HISTORY : Reviewed.  No changes.  CURRENT MEDICATIONS: Reviewed per [facility]  REVIEW OF SYSTEMS: Unobtainable due to dementia  PHYSICAL EXAMINATION  GENERAL: no acute distress, normal body habitus NECK: supple, trachea midline, no neck masses, no thyroid tenderness, no thyromegaly LYMPHATIC:  No adenopathy in the cervical, supra-clavicular or axillary area. RESPIRATORY: breathing is even & unlabored, BS CTAB CARDIAC: RRR, no murmur,no extra heart sounds, no edema GI: abdomen soft, normal BS, no masses, no tenderness, no hepatomegaly, no splenomegaly PSYCHIATRIC: the patient is alert & disoriented, affect & behavior appropriate  LABS/RADIOLOGY: 11/12/13  hemoglobin A1c 8.0 5-15 CBC and CMP normal   7-14 CBC and CMP normal 1/14 glucose 196 otherwise CMP normal, CBC normal  ASSESSMENT/PLAN:  Alzheimer's dementia  -  advanced; continue Namenda and Galantamine Diabetes  mellitus   -   continue NovoLog and Lantus Hypertension  -  well-controlled; continue Norvasc Osteoarthritis  -  appears comfortable; continue acetaminophen Constipation  -  continue Senokot S.   CPT CODE: 40981  Ella Bodo - NP [facility] [phone]

## 2014-03-30 ENCOUNTER — Non-Acute Institutional Stay (SKILLED_NURSING_FACILITY): Payer: Medicare Other | Admitting: Adult Health

## 2014-03-30 ENCOUNTER — Encounter: Payer: Self-pay | Admitting: Adult Health

## 2014-03-30 DIAGNOSIS — K5900 Constipation, unspecified: Secondary | ICD-10-CM

## 2014-03-30 DIAGNOSIS — G309 Alzheimer's disease, unspecified: Secondary | ICD-10-CM

## 2014-03-30 DIAGNOSIS — I10 Essential (primary) hypertension: Secondary | ICD-10-CM

## 2014-03-30 DIAGNOSIS — E1165 Type 2 diabetes mellitus with hyperglycemia: Secondary | ICD-10-CM

## 2014-03-30 DIAGNOSIS — IMO0002 Reserved for concepts with insufficient information to code with codable children: Secondary | ICD-10-CM

## 2014-03-30 DIAGNOSIS — F0280 Dementia in other diseases classified elsewhere without behavioral disturbance: Secondary | ICD-10-CM

## 2014-03-30 DIAGNOSIS — M159 Polyosteoarthritis, unspecified: Secondary | ICD-10-CM

## 2014-03-30 NOTE — Progress Notes (Signed)
Patient ID: Adrienne Bailey, female   DOB: 05-Jul-1923, 80 y.o.   MRN: [id number]   03/30/2014  Facility:  Nursing Home Location:  [facility] Room Number: 1002-2 LEVEL OF CARE:  SNF (31)  Routine Visit  Chief Complaint  Patient presents with  . Medical Management of Chronic Issues    Diabetes Mellitus, Dementia, Hypertension, Generalized Osteoarthrosis and Constipation    HISTORY OF PRESENT ILLNESS:  REASSESSMENT OF ONGOING PROBLEMS:  HTN: Pt 's HTN remains stable. No complications from the medications currently being used.   Last BP : 130/63  CONSTIPATION: The constipation remains stable. No complications from the medications presently being used.   OSTEOARTHROSIS:  Pain is well-controlled; no complications noted from current medication.  PAST MEDICAL HISTORY:  Past Medical History  Diagnosis Date  . Alzheimer's disease   . Dementia   . Diabetes mellitus without mention of complication   . Endothelial corneal dystrophy   . [place] blindness, as defined in [place]   . Unspecified glaucoma   . Dysphagia   . Depressive disorder, not elsewhere classified   . Hypertension   . Chronic kidney disease, unspecified   . Renal disorder   . Generalized pain   . Osteoporosis     CURRENT MEDICATIONS: Reviewed per MAR/see medication list  Allergies  Allergen Reactions  . Ppd [Tuberculin Purified Protein Derivative]      REVIEW OF SYSTEMS:  GENERAL: no change in appetite, no fatigue, no weight changes, no fever, chills or weakness RESPIRATORY: no cough, SOB, DOE, wheezing, hemoptysis CARDIAC: no chest pain, edema or palpitations GI: no abdominal pain, diarrhea, constipation, heart burn, nausea or vomiting  PHYSICAL EXAMINATION  GENERAL: no acute distress, normal body habitus NECK: supple, trachea midline, no neck masses, no thyroid tenderness, no thyromegaly LYMPHATICS: no LAN in the neck, no supraclavicular LAN RESPIRATORY: breathing is even &  unlabored, BS CTAB CARDIAC: RRR, no murmur,no extra heart sounds, no edema GI: abdomen soft, normal BS, no masses, no tenderness, no hepatomegaly, no splenomegaly EXTREMITIES: able to move all 4 extremities but with generalized weakness; uses wheelchair   PSYCHIATRIC: the patient is alert & oriented to person, affect & behavior appropriate  LABS/RADIOLOGY: 02/03/14  hgbA1c 7.8 11/12/13  hemoglobin A1c 8.0 5-15 CBC and CMP normal   7-14 CBC and CMP normal 1/14 glucose 196 otherwise CMP normal, CBC normal  ASSESSMENT/PLAN:  Alzheimer's dementia  -  advanced; continue Namenda and Galantamine Diabetes mellitus   -   continue NovoLog and Lantus Hypertension  -  well-controlled; continue Norvasc Osteoarthrosis -  appears comfortable; continue acetaminophen Constipation  -  continue Senokot S.   CPT CODE: 4696299309    [facility] Center[facility]INA-VARGAS,MONINA, NP [facility] [phone]

## 2014-05-25 ENCOUNTER — Non-Acute Institutional Stay (SKILLED_NURSING_FACILITY): Payer: Medicare Other | Admitting: Adult Health

## 2014-05-25 ENCOUNTER — Encounter: Payer: Self-pay | Admitting: Adult Health

## 2014-05-25 DIAGNOSIS — K5900 Constipation, unspecified: Secondary | ICD-10-CM

## 2014-05-25 DIAGNOSIS — I10 Essential (primary) hypertension: Secondary | ICD-10-CM

## 2014-05-25 DIAGNOSIS — M159 Polyosteoarthritis, unspecified: Secondary | ICD-10-CM

## 2014-05-25 DIAGNOSIS — IMO0002 Reserved for concepts with insufficient information to code with codable children: Secondary | ICD-10-CM

## 2014-05-25 DIAGNOSIS — F0280 Dementia in other diseases classified elsewhere without behavioral disturbance: Secondary | ICD-10-CM

## 2014-05-25 DIAGNOSIS — G309 Alzheimer's disease, unspecified: Secondary | ICD-10-CM

## 2014-05-25 DIAGNOSIS — E1165 Type 2 diabetes mellitus with hyperglycemia: Secondary | ICD-10-CM

## 2014-05-25 NOTE — Progress Notes (Signed)
Patient ID: Adrienne Bailey, female   DOB: 1924/04/17, 81 y.o.   MRN: [id number]   05/25/2014  Facility:  Nursing Home Location:  [facility] Room Number: 1002-2 LEVEL OF CARE:  SNF (31)  Routine Visit  Chief Complaint  Patient presents with  . Medical Management of Chronic Issues    Diabetes mellitus, hypertension, constipation, Alzheimer's disease and generalized osteoarthritis     HISTORY OF PRESENT ILLNESS:  REASSESSMENT OF ONGOING PROBLEMS:  HTN: Pt 's HTN remains stable. No complications from the medications currently being used.   Last BP : 136/77  DEMENTIA: The dementia remaines stable and continues to function adequately in the current living environment with supervision.  The patient has had little changes in behavior. No complications noted from the medications presently being used.  DM:pt's DM remains stable.  Pt denies polyuria, polydipsia, polyphagia, changes in vision or hypoglycemic episodes.  No complications noted from the medication presently being used.   9/15 hemoglobin A1c is: 7.8  PAST MEDICAL HISTORY:  Past Medical History  Diagnosis Date  . Alzheimer's disease   . Dementia   . Diabetes mellitus without mention of complication   . Endothelial corneal dystrophy   . Legal blindness, as defined in [place]   . Unspecified glaucoma   . Dysphagia   . Depressive disorder, not elsewhere classified   . Hypertension   . Chronic kidney disease, unspecified   . Renal disorder   . Generalized pain   . Osteoporosis     CURRENT MEDICATIONS: Reviewed per MAR/see medication list  Allergies  Allergen Reactions  . Ppd [Tuberculin Purified Protein Derivative]     REVIEW OF SYSTEMS:  Unable to obtain due to advance dementia  PHYSICAL EXAMINATION  GENERAL: no acute distress, normal body habitus EYES:  Right eye close; Left eye is whitish NECK: supple, trachea midline, no neck masses, no thyroid tenderness, no thyromegaly LYMPHATICS:  no LAN in the neck, no supraclavicular LAN RESPIRATORY: breathing is even & unlabored, BS CTAB CARDIAC: RRR, no murmur,no extra heart sounds, no edema GI: abdomen soft, normal BS, no masses, no tenderness, no hepatomegaly, no splenomegaly EXTREMITIES: able to move BUE; did not move BLE for me today; uses wheelchair PSYCHIATRIC: the patient is nonverbal; mood is stable  LABS/RADIOLOGY: 02/03/14  hgbA1c 7.8 11/12/13  hemoglobin A1c 8.0 5-15 CBC and CMP normal   7-14 CBC and CMP normal 1/14 glucose 196 otherwise CMP normal, CBC normal  ASSESSMENT/PLAN:  Alzheimer's dementia  -  advanced; continue Namenda and Galantamine Diabetes mellitus   -   continue NovoLog and Lantus Hypertension  -  well-controlled; continue Norvasc Osteoarthrosis -  appears comfortable; continue acetaminophen Constipation  -  continue Senokot S.   Goals of care:   Long-term care    Labs/test ordered:   CBC, CMP, HgbA1c, albumin in urine      CPT CODE: 19147    [facility], NP [facility] [phone]

## 2014-05-26 LAB — BASIC METABOLIC PANEL
BUN: 13 mg/dL (ref 4–21)
Creatinine: 0.7 mg/dL (ref <=1.1)
Glucose: 145 mg/dL

## 2014-05-26 LAB — CBC AND DIFFERENTIAL
HEMATOCRIT: 40 % (ref 36–46)
Hemoglobin: 12.9 g/dL (ref 12.0–16.0)
PLATELETS: 237 10*3/uL (ref 150–399)
WBC: 5.1 10*3/mL

## 2014-05-26 LAB — HEMOGLOBIN A1C: HEMOGLOBIN A1C: 9 % — AB (ref 4.0–6.0)

## 2014-08-07 ENCOUNTER — Non-Acute Institutional Stay (SKILLED_NURSING_FACILITY): Payer: Medicare Other | Admitting: Adult Health

## 2014-08-07 ENCOUNTER — Encounter: Payer: Self-pay | Admitting: Adult Health

## 2014-08-07 DIAGNOSIS — E1165 Type 2 diabetes mellitus with hyperglycemia: Secondary | ICD-10-CM | POA: Diagnosis not present

## 2014-08-07 DIAGNOSIS — I10 Essential (primary) hypertension: Secondary | ICD-10-CM | POA: Diagnosis not present

## 2014-08-07 DIAGNOSIS — G309 Alzheimer's disease, unspecified: Secondary | ICD-10-CM

## 2014-08-07 DIAGNOSIS — IMO0002 Reserved for concepts with insufficient information to code with codable children: Secondary | ICD-10-CM

## 2014-08-07 DIAGNOSIS — K5900 Constipation, unspecified: Secondary | ICD-10-CM

## 2014-08-07 DIAGNOSIS — F0280 Dementia in other diseases classified elsewhere without behavioral disturbance: Secondary | ICD-10-CM

## 2014-08-07 DIAGNOSIS — M159 Polyosteoarthritis, unspecified: Secondary | ICD-10-CM | POA: Diagnosis not present

## 2014-08-07 NOTE — Progress Notes (Signed)
Patient ID: Adrienne Bailey, female   DOB: 1923/07/26, 81 y.o.   MRN: [id number]   08/07/2014  Facility:  Nursing Home Location:  [facility] Room Number: 1002-2 LEVEL OF CARE:  SNF (31)  Routine Visit  Chief Complaint  Patient presents with  . Medical Management of Chronic Issues    Diabetes mellitus, hypertension, constipation, Alzheimer's disease and generalized osteoarthritis    HISTORY OF PRESENT ILLNESS:  This is an 81 year old female who [facility] She has PMH of Alzheimer's dementia, diabetes mellitus, hypertension, osteoarthrosis and constipation. She has been stable for the past month.  PAST MEDICAL HISTORY:  Past Medical History  Diagnosis Date  . Alzheimer's disease   . Dementia   . Diabetes mellitus without mention of complication   . Endothelial corneal dystrophy   . [place] blindness, [place] defined in [place]   . Unspecified glaucoma   . Dysphagia   . Depressive disorder, not elsewhere classified   . Hypertension   . Chronic kidney disease, unspecified   . Renal disorder   . Generalized pain   . Osteoporosis     CURRENT MEDICATIONS: Reviewed per MAR/see medication list  Allergies  Allergen Reactions  . Ppd [Tuberculin Purified Protein Derivative]     REVIEW OF SYSTEMS:  Unable to obtain due to advance dementia  PHYSICAL EXAMINATION  GENERAL: no acute distress, normal body habitus EYES:  Right eye blind; Left eye is whitish LYMPHATICS: no LAN in the neck, no supraclavicular LAN RESPIRATORY: breathing is even & unlabored, BS CTAB CARDIAC: RRR, no murmur,no extra heart sounds, no edema GI: abdomen soft, normal BS, no masses, no tenderness, no hepatomegaly, no splenomegaly EXTREMITIES: able to move BUE; did not move BLE ; uses wheelchair PSYCHIATRIC: the patient is nonverbal; mood is stable  LABS/RADIOLOGY: 07/10/14  RLE ABI values for right ABI calculation 88/138 = 0.63; abnormally decreased right  ankle brachial index value, implying moderate right lower extremity peripheral arterial disease 06/25/14  Albumin 3.2 05/26/14  microalbumin in 4.8 hemoglobin A1c 9.0 05/25/14  WBC 5.1 hemoglobin 12.9 hematocrit 40.1 MCV 92.2 sodium 138 potassium 4.0 glucose 145 BUN 13 creatinine 0.7 calcium 9.4 total protein 6.5 albumin 3.3 total bilirubin 0.4 ALP 61 AST 12 ALT 7 GFR >60 02/03/14  hgbA1c 7.8 11/12/13  hemoglobin A1c 8.0 5-15 CBC and CMP normal   7-14 CBC and CMP normal 1/14 glucose 196 otherwise CMP normal, CBC normal  ASSESSMENT/PLAN:  Alzheimer's dementia  -  advanced; continue Namenda and Galantamine Diabetes mellitus   -   hemoglobin A1c 9.0; continue NovoLog and Lantus Hypertension  -  well-controlled; continue Norvasc Osteoarthrosis -  appears comfortable; continue acetaminophen Constipation  -  continue Senokot S.   Goals of care:   Long-term care   Labs/test ordered:   none     MEDINA-VARGAS,Thompson Mckim, NP [facility] [phone]

## 2014-09-19 ENCOUNTER — Encounter: Payer: Self-pay | Admitting: Adult Health

## 2014-09-19 ENCOUNTER — Non-Acute Institutional Stay (SKILLED_NURSING_FACILITY): Payer: Medicare Other | Admitting: Adult Health

## 2014-09-19 DIAGNOSIS — F0280 Dementia in other diseases classified elsewhere without behavioral disturbance: Secondary | ICD-10-CM

## 2014-09-19 DIAGNOSIS — I10 Essential (primary) hypertension: Secondary | ICD-10-CM | POA: Diagnosis not present

## 2014-09-19 DIAGNOSIS — E118 Type 2 diabetes mellitus with unspecified complications: Secondary | ICD-10-CM | POA: Diagnosis not present

## 2014-09-19 DIAGNOSIS — G309 Alzheimer's disease, unspecified: Secondary | ICD-10-CM

## 2014-09-19 DIAGNOSIS — K5900 Constipation, unspecified: Secondary | ICD-10-CM

## 2014-09-19 DIAGNOSIS — M159 Polyosteoarthritis, unspecified: Secondary | ICD-10-CM | POA: Diagnosis not present

## 2014-09-19 NOTE — Progress Notes (Signed)
Patient ID: Adrienne Bailey, female   DOB: 10-17-1923, 81 y.o.   MRN: [id number]   09/19/2014  Facility:  Nursing Home [facility] Room Number: 1002-2 LEVEL OF CARE:  SNF (31)   Chief Complaint  Patient presents with  . Medical Management of Chronic Issues    Diabetes mellitus, hypertension, constipation, Alzheimer's disease and generalized osteoarthritis    HISTORY OF PRESENT ILLNESS:  This is an 81 year old female who is a [facility]. She has PMH of Alzheimer's dementia, diabetes mellitus, hypertension, osteoarthrosis and constipation. She has been stable for the past month. Her latest hgbA1c is 9.0. She takes Lantus and Humalog for her DM, type 2. Her hypertension is well-controlled with Norvasc. Her alzheimer's dementia is advanced and she is currently taking galantamine and Namenda daily for her Alzheimer's dementia.  PAST MEDICAL HISTORY:  Past Medical History  Diagnosis Date  . Alzheimer's disease   . Dementia   . Diabetes mellitus without mention of complication   . Endothelial corneal dystrophy   . Legal blindness, [place] defined in [place]   . Unspecified glaucoma   . Dysphagia   . Depressive disorder, not elsewhere classified   . Hypertension   . Chronic kidney disease, unspecified   . Renal disorder   . Generalized pain   . Osteoporosis     CURRENT MEDICATIONS: Reviewed per MAR/see medication list  Allergies  Allergen Reactions  . Ppd [Tuberculin Purified Protein Derivative]     REVIEW OF SYSTEMS:  Unable to obtain due to advance dementia  PHYSICAL EXAMINATION  GENERAL: no acute distress, normal body habitus EYES:  Right eye blind; Left eye is whitish RESPIRATORY: breathing is even & unlabored, BS CTAB CARDIAC: RRR, no murmur,no extra heart sounds, no edema GI: abdomen soft, normal BS, no masses, no tenderness, no hepatomegaly, no splenomegaly EXTREMITIES: able to move BUE; did not move BLE ; uses  wheelchair PSYCHIATRIC: the patient is nonverbal; mood is stable  LABS/RADIOLOGY: Labs reviewed: 07/10/14  RLE ABI values for right ABI calculation 88/138 = 0.63; abnormally decreased right ankle brachial index value, implying moderate right lower extremity peripheral arterial disease 06/25/14  Albumin 3.2 05/26/14  microalbumin in 4.8 hemoglobin A1c 9.0 05/25/14  WBC 5.1 hemoglobin 12.9 hematocrit 40.1 MCV 92.2 sodium 138 potassium 4.0 glucose 145 BUN 13 creatinine 0.7 calcium 9.4 total protein 6.5 albumin 3.3 total bilirubin 0.4 ALP 61 AST 12 ALT 7 GFR >60 02/03/14  hgbA1c 7.8 11/12/13  hemoglobin A1c 8.0 5-15 CBC and CMP normal   7-14 CBC and CMP normal 1/14 glucose 196 otherwise CMP normal, CBC normal  ASSESSMENT/PLAN:  Alzheimer's dementia  -  advanced; continue Namenda and Galantamine Diabetes mellitus   -   hemoglobin A1c 9.0; continue NovoLog and Lantus Hypertension  -  well-controlled; continue Norvasc Osteoarthrosis -  appears comfortable; continue acetaminophen Constipation  -  continue Senokot S.   Goals of care:   Long-term care   Labs/test ordered:   CBC and CMP     MEDINA-VARGAS,MONINA, NP [facility] [phone]

## 2014-09-21 LAB — CBC AND DIFFERENTIAL
HCT: 41 % (ref 36–46)
Hemoglobin: 13.9 g/dL (ref 12.0–16.0)
PLATELETS: 264 10*3/uL (ref 150–399)
WBC: 4.9 10^3/mL

## 2014-09-21 LAB — BASIC METABOLIC PANEL
BUN: 14 mg/dL (ref 4–21)
Creatinine: 0.6 mg/dL (ref 0.5–1.1)
Glucose: 155 mg/dL
Potassium: 4.4 mmol/L (ref 3.4–5.3)
SODIUM: 141 mmol/L (ref 137–147)

## 2014-09-21 LAB — HEPATIC FUNCTION PANEL
ALK PHOS: 79 U/L (ref 25–125)
ALT: 10 U/L (ref 7–35)
AST: 17 U/L (ref 13–35)
Bilirubin, Total: 0.4 mg/dL

## 2014-09-29 LAB — HEMOGLOBIN A1C: Hgb A1c MFr Bld: 9 % — AB (ref 4.0–6.0)

## 2014-12-29 LAB — HEMOGLOBIN A1C: Hgb A1c MFr Bld: 8.9 % — AB (ref 4.0–6.0)

## 2015-01-08 ENCOUNTER — Encounter: Payer: Self-pay | Admitting: Adult Health

## 2015-01-08 ENCOUNTER — Non-Acute Institutional Stay (SKILLED_NURSING_FACILITY): Payer: Medicare Other | Admitting: Adult Health

## 2015-01-08 DIAGNOSIS — K5900 Constipation, unspecified: Secondary | ICD-10-CM | POA: Diagnosis not present

## 2015-01-08 DIAGNOSIS — E118 Type 2 diabetes mellitus with unspecified complications: Secondary | ICD-10-CM | POA: Diagnosis not present

## 2015-01-08 DIAGNOSIS — H409 Unspecified glaucoma: Secondary | ICD-10-CM

## 2015-01-08 DIAGNOSIS — F0280 Dementia in other diseases classified elsewhere without behavioral disturbance: Secondary | ICD-10-CM

## 2015-01-08 DIAGNOSIS — M159 Polyosteoarthritis, unspecified: Secondary | ICD-10-CM

## 2015-01-08 DIAGNOSIS — G309 Alzheimer's disease, unspecified: Secondary | ICD-10-CM

## 2015-01-08 DIAGNOSIS — I10 Essential (primary) hypertension: Secondary | ICD-10-CM | POA: Diagnosis not present

## 2015-01-10 NOTE — Progress Notes (Signed)
Patient ID: Adrienne Bailey, female   DOB: 1924/04/04, 81 y.o.   MRN: [id number]   01/08/15  Facility:  Nursing Home Location:  [facility] Room Number: 1002-2 LEVEL OF CARE:  SNF (31)   Chief Complaint  Patient presents with  . Medical Management of Chronic Issues    Diabetes mellitus, hypertension, constipation, Alzheimer's disease, glaucoma and generalized osteoarthritis    HISTORY OF PRESENT ILLNESS:  This is an 81 year old female who is a long-term resident at [facility]. She has PMH of Alzheimer's dementia, diabetes mellitus, hypertension, osteoarthrosis and constipation. Latest hgbA1c is 8.9. She takes Lantus and Humalog for her DM, type 2. Her hypertension is well-controlled with Norvasc. Her alzheimer's dementia is advanced and she is currently taking galantamine and Namenda daily for her Alzheimer's dementia.  PAST MEDICAL HISTORY:  Past Medical History  Diagnosis Date  . Alzheimer's disease   . Dementia   . Diabetes mellitus without mention of complication   . Endothelial corneal dystrophy   . Legal blindness, as defined in [place]   . Unspecified glaucoma   . Dysphagia   . Depressive disorder, not elsewhere classified   . Hypertension   . Chronic kidney disease, unspecified   . Renal disorder   . Generalized pain   . Osteoporosis     CURRENT MEDICATIONS: Reviewed per MAR/see medication list    Medication List       This list is accurate as of: 01/08/15 11:59 PM.  Always use your most recent med list.               acetaminophen 325 MG tablet  Commonly known as:  TYLENOL  Take 650 mg by mouth 2 (two) times daily. For pain     albuterol (5 MG/ML) 0.5% nebulizer solution  Commonly known as:  PROVENTIL  Take 0.5 mLs (2.5 mg total) by nebulization every 4 (four) hours as needed for wheezing or shortness of breath.     amLODipine 10 MG tablet  Commonly known as:  NORVASC  Take 10 mg by mouth daily. For HTN     aspirin 81 MG  chewable tablet  Chew by mouth daily. Take 1 tablet by mouth daily     calcium-vitamin D 500-200 MG-UNIT per tablet  Commonly known as:  OSCAL WITH D  Take 1 tablet by mouth 3 (three) times daily.     Cranberry 405 MG Caps  Take 405 mg by mouth 2 (two) times daily.     DECUBI-VITE PO  Take by mouth. 1 by mouth daily for wound healing     galantamine 12 MG tablet  Commonly known as:  RAZADYNE  Take 12 mg by mouth. Take 1 tablet by mouth twice daily for alzheimers     INSTA-GLUCOSE 40 % Gel  Generic drug:  dextrose  Take 1 Tube by mouth. If fingerstick blood sugar is less than 60 and resident can swallow give glucose gel and recheck  In 30 minutes and continue hypoglycemia.     insulin aspart 100 UNIT/ML injection  Commonly known as:  novoLOG  Inject 5 Units into the skin. For CBG>250     insulin glargine 100 UNIT/ML injection  Commonly known as:  LANTUS  Inject into the skin. Inject 14 units subcutaneously every night at bedtime * do not mix with other insulins *     ipratropium 0.02 % nebulizer solution  Commonly known as:  ATROVENT  Take 2.5 mLs (0.5 mg total) by nebulization  every 4 (four) hours as needed.     NAMENDA XR 28 MG Cp24 24 hr capsule  Generic drug:  memantine  Take 1 capsule by mouth daily.     prednisoLONE acetate 1 % ophthalmic suspension  Commonly known as:  PRED FORTE  Place 1 drop into the left eye daily after breakfast.     PROCEL Powd  Take by mouth. Give 1 scoop twice daily for nutritional support and to increase protein     sennosides-docusate sodium 8.6-50 MG tablet  Commonly known as:  SENOKOT-S  Take 2 tablets by mouth. Take 2 tablets by mouth every night at bedtime for constipation         Allergies  Allergen Reactions  . Ppd [Tuberculin Purified Protein Derivative]   . Tape     REVIEW OF SYSTEMS:  Unable to obtain due to advanced dementia  PHYSICAL EXAMINATION  GENERAL: no acute distress, normal body habitus EYES:  Right eye  blind; Left eye is whitish RESPIRATORY: breathing is even & unlabored, BS CTAB CARDIAC: RRR, no murmur,no extra heart sounds, no edema GI: abdomen soft, normal BS, no masses, no tenderness, no hepatomegaly, no splenomegaly EXTREMITIES: able to move BUE; did not move BLE ; uses wheelchair PSYCHIATRIC: the patient is nonverbal; mood is stable  LABS/RADIOLOGY: Labs reviewed: 12/29/14  hgbA1c 8.8 09/29/14  hgbA1c 9.0 09/21/14  WBC 4.9 hemoglobin 13.9 hematocrit 41.0 MCV 90.1 platelet count 264 sodium 141 potassium 4.4 glucose 155 BUN 14 creatinine 0.61 total bil 0.4 alkaline phosphatase 79 SGOT 17 SGPT 10 total protein 7.7 albumin 3.7 calcium 9.4 07/10/14  RLE ABI values for right ABI calculation 88/138 = 0.63; abnormally decreased right ankle brachial index value, implying moderate right lower extremity peripheral arterial disease 06/25/14  Albumin 3.2 05/26/14  microalbumin in 4.8 hemoglobin A1c 9.0 05/25/14  WBC 5.1 hemoglobin 12.9 hematocrit 40.1 MCV 92.2 sodium 138 potassium 4.0 glucose 145 BUN 13 creatinine 0.7 calcium 9.4 total protein 6.5 albumin 3.3 total bilirubin 0.4 ALP 61 AST 12 ALT 7 GFR >60 02/03/14  hgbA1c 7.8 11/12/13  hemoglobin A1c 8.0 5-15 CBC and CMP normal   7-14 CBC and CMP normal 1/14 glucose 196 otherwise CMP normal, CBC normal  ASSESSMENT/PLAN:  Alzheimer's dementia  -  advanced; continue Namenda and Galantamine  Diabetes mellitus   -   hemoglobin A1c 8.9; continue NovoLog 5 units SQ BID for CBG >=250 and Lantus 14 units SQ Q HS  Hypertension  -  well-controlled; continue Norvasc 10 mg 1 tab daily  Osteoarthrosis -  appears comfortable; continue acetaminophen 325 mg 2 tabs = 650 mg PO BID  Constipation  -  continue Senokot S 2 tabs PO Q HS  Glaucoma - continue Prednisolone acetate 1% susp. 1 gtt to left eye daily    Goals of care:   Long-term care    [facility], NP [facility] [phone]

## 2015-01-29 ENCOUNTER — Non-Acute Institutional Stay (SKILLED_NURSING_FACILITY): Payer: Medicare Other | Admitting: Internal Medicine

## 2015-01-29 DIAGNOSIS — G309 Alzheimer's disease, unspecified: Secondary | ICD-10-CM | POA: Diagnosis not present

## 2015-01-29 DIAGNOSIS — I739 Peripheral vascular disease, unspecified: Secondary | ICD-10-CM | POA: Diagnosis not present

## 2015-01-29 DIAGNOSIS — I10 Essential (primary) hypertension: Secondary | ICD-10-CM

## 2015-01-29 DIAGNOSIS — F0280 Dementia in other diseases classified elsewhere without behavioral disturbance: Secondary | ICD-10-CM

## 2015-01-29 DIAGNOSIS — E46 Unspecified protein-calorie malnutrition: Secondary | ICD-10-CM | POA: Diagnosis not present

## 2015-01-29 DIAGNOSIS — E1151 Type 2 diabetes mellitus with diabetic peripheral angiopathy without gangrene: Secondary | ICD-10-CM | POA: Diagnosis not present

## 2015-01-29 NOTE — Progress Notes (Signed)
Patient ID: Adrienne Bailey, female   DOB: 1923/08/28, 81 y.o.   MRN: [id number]    [facility]  Chief Complaint  Patient presents with  . Medical Management of Chronic Issues   Allergies  Allergen Reactions  . Ppd [Tuberculin Purified Protein Derivative]   . Tape    Code status: full code  HPI 81 y/o female patient is seen for routine visit. She has been at her baseline. She is legally blind and is under total care. She is sitting on the hallway on her wheelchair. No falls reported. No skin breakdown. No acute behavioral changes reported by staff. Unable to obtain HPI and ROS from patient. She has PMH of HTN, DM, constipation, dementia among others. cbg on review has been mostly > 200  ROS Unable to provide  Past Medical History  Diagnosis Date  . Alzheimer's disease   . Dementia   . Diabetes mellitus without mention of complication   . Endothelial corneal dystrophy   . Legal blindness, as defined in [place]   . Unspecified glaucoma   . Dysphagia   . Depressive disorder, not elsewhere classified   . Hypertension   . Chronic kidney disease, unspecified   . Renal disorder   . Generalized pain   . Osteoporosis      Medication List       This list is accurate as of: 01/29/15  4:51 PM.  Always use your most recent med list.               acetaminophen 325 MG tablet  Commonly known as:  TYLENOL  Take 650 mg by mouth 2 (two) times daily. For pain     albuterol (5 MG/ML) 0.5% nebulizer solution  Commonly known as:  PROVENTIL  Take 0.5 mLs (2.5 mg total) by nebulization every 4 (four) hours as needed for wheezing or shortness of breath.     amLODipine 10 MG tablet  Commonly known as:  NORVASC  Take 10 mg by mouth daily. For HTN     aspirin 81 MG chewable tablet  Chew by mouth daily. Take 1 tablet by mouth daily     calcium-vitamin D 500-200 MG-UNIT per tablet  Commonly known as:  OSCAL WITH D  Take 1 tablet by mouth 3 (three) times  daily.     Cranberry 405 MG Caps  Take 405 mg by mouth 2 (two) times daily.     DECUBI-VITE PO  Take by mouth. 1 by mouth daily for wound healing     galantamine 12 MG tablet  Commonly known as:  RAZADYNE  Take 12 mg by mouth. Take 1 tablet by mouth twice daily for alzheimers     INSTA-GLUCOSE 40 % Gel  Generic drug:  dextrose  Take 1 Tube by mouth. If fingerstick blood sugar is less than 60 and resident can swallow give glucose gel and recheck  In 30 minutes and continue hypoglycemia.     insulin aspart 100 UNIT/ML injection  Commonly known as:  novoLOG  Inject 5 Units into the skin. For CBG>250     insulin glargine 100 UNIT/ML injection  Commonly known as:  LANTUS  Inject into the skin. Inject 14 units subcutaneously every night at bedtime * do not mix with other insulins *     ipratropium 0.02 % nebulizer solution  Commonly known as:  ATROVENT  Take 2.5 mLs (0.5 mg total) by nebulization every 4 (four) hours as needed.     NAMENDA  XR 28 MG Cp24 24 hr capsule  Generic drug:  memantine  Take 1 capsule by mouth daily.     prednisoLONE acetate 1 % ophthalmic suspension  Commonly known as:  PRED FORTE  Place 1 drop into the left eye daily after breakfast.     PROCEL Powd  Take by mouth. Give 1 scoop twice daily for nutritional support and to increase protein     sennosides-docusate sodium 8.6-50 MG tablet  Commonly known as:  SENOKOT-S  Take 2 tablets by mouth. Take 2 tablets by mouth every night at bedtime for constipation        Physical exam BP 96/72 mmHg  Pulse 89  Temp(Src) 98 F (36.7 C)  Resp 18  Ht  (1.549 m)  Wt 117 lb 12.8 oz (53.434 kg)  BMI 22.27 kg/m2  Wt Readings from Last 3 Encounters:  01/29/15 117 lb 12.8 oz (53.434 kg)  01/08/15 117 lb 3.2 oz (53.162 kg)  09/19/14 121 lb 3.2 oz (54.976 kg)   General- elderly frail female in no acute distress Head- atraumatic, normocephalic Eyes- legally blind, no pallor, no icterus Neck- no  lymphadenopathy, no thyromegaly, no jugular vein distension Cardiovascular- normal s1,s2, no murmurs Respiratory- bilateral clear to auscultation, no wheeze, no rhonchi, no crackles Abdomen- bowel sounds present, soft, non tender Musculoskeletal- able to move her upper extremities, unable to move her LE. No leg edema. on wheelchair Neurological- non verbal, unable to assess   Labs 12/29/14 a1c 8.9 09/29/14 a1c 9 09/21/14 wbc 4.9, hb 13.9, hct 41.1, plt 264, na 141, k 4.4, bun 14, cr 0.61, lft wnl, alb 3.7 07/10/14 ABI- moderate RLE PAD  Assessment/plan  Diabetes mellitus Uncontrolled, cbg mostly above 200. Currently on lantus 14 u with novolog 5 u bid for cbg > 250. Change this to lantus 18 u for now and monitor. continue baby aspirin  Protein calorie malnutrition Stable weight, continue fortified food, procel with mighty shake, monitor weight. Assistance with feeding.  HTN Low bp reading. On review 110-140/ 50-70 mostly.On norvasc 10 mg daily, decrease this to 5 mg daily and monitor bp once a day for 2 weeks and reassess  Alzheimer's dementia Advanced, progressing, decline anticipated. Continue namenda xr and razadyne for now with MVI. Monitor weight. Fall precautions. Pressure ulcer prophylaxis  PAD RLE has moderated PAD seen on ABI. Continue baby aspirin   Oneal Grout, MD  [facility] [phone] (Monday-Friday 8 am - 5 pm) [phone] (afterhours)

## 2015-03-29 ENCOUNTER — Non-Acute Institutional Stay (SKILLED_NURSING_FACILITY): Payer: Medicare Other | Admitting: Internal Medicine

## 2015-03-29 ENCOUNTER — Encounter: Payer: Self-pay | Admitting: Internal Medicine

## 2015-03-29 DIAGNOSIS — N390 Urinary tract infection, site not specified: Secondary | ICD-10-CM

## 2015-03-29 DIAGNOSIS — E1151 Type 2 diabetes mellitus with diabetic peripheral angiopathy without gangrene: Secondary | ICD-10-CM

## 2015-03-29 DIAGNOSIS — K5901 Slow transit constipation: Secondary | ICD-10-CM | POA: Diagnosis not present

## 2015-03-29 DIAGNOSIS — G309 Alzheimer's disease, unspecified: Secondary | ICD-10-CM

## 2015-03-29 DIAGNOSIS — I10 Essential (primary) hypertension: Secondary | ICD-10-CM

## 2015-03-29 DIAGNOSIS — F0280 Dementia in other diseases classified elsewhere without behavioral disturbance: Secondary | ICD-10-CM | POA: Diagnosis not present

## 2015-03-29 NOTE — Progress Notes (Signed)
Patient ID: Adrienne Bailey, female   DOB: 07/06/1923, 81 y.o.   MRN: [id number]     [facility]  Chief Complaint  Patient presents with  . Medical Management of Chronic Issues    Routine Visit    Allergies  Allergen Reactions  . Ppd [Tuberculin Purified Protein Derivative]   . Tape    Code status: full code  HPI 81 y/o female patient is seen for routine visit. She has been at her baseline. She is legally blind and is under total care. She has been sleeping a lot lately per nursing staff. cbg on review 130-250 with one reading of 348 and one of 68. Po intake is good. bp on review 115-135/66-71 with one reading of 166/71. No falls reported. No skin breakdown. No acute behavioral changes reported by staff. Unable to obtain HPI and ROS from patient. She has PMH of HTN, DM, constipation, dementia among others.   ROS Unable to provide  Past Medical History  Diagnosis Date  . Alzheimer's disease   . Dementia   . Diabetes mellitus without mention of complication   . Endothelial corneal dystrophy   . Legal blindness, as defined in [place]   . Unspecified glaucoma   . Dysphagia   . Depressive disorder, not elsewhere classified   . Hypertension   . Chronic kidney disease, unspecified (HCC)   . Renal disorder   . Generalized pain   . Osteoporosis      Medication List       This list is accurate as of: 03/29/15  6:10 PM.  Always use your most recent med list.               acetaminophen 325 MG tablet  Commonly known as:  TYLENOL  Take 650 mg by mouth 2 (two) times daily. For pain     albuterol (5 MG/ML) 0.5% nebulizer solution  Commonly known as:  PROVENTIL  Take 0.5 mLs (2.5 mg total) by nebulization every 4 (four) hours as needed for wheezing or shortness of breath.     amLODipine 5 MG tablet  Commonly known as:  NORVASC  Take 5 mg by mouth daily.     aspirin 81 MG chewable tablet  Chew by mouth daily. Take 1 tablet by mouth daily     calcium-vitamin D 500-200 MG-UNIT tablet  Commonly known as:  OSCAL WITH D  Take 1 tablet by mouth 3 (three) times daily.     Cranberry 405 MG Caps  Take 405 mg by mouth 2 (two) times daily.     DECUBI-VITE PO  Take by mouth. 1 by mouth daily for wound healing     galantamine 12 MG tablet  Commonly known as:  RAZADYNE  Take 12 mg by mouth. Take 1 tablet by mouth twice daily for alzheimers     INSTA-GLUCOSE 40 % Gel  Generic drug:  dextrose  Take 1 Tube by mouth. If fingerstick blood sugar is less than 60 and resident can swallow give glucose gel and recheck  In 30 minutes and continue hypoglycemia.     insulin aspart 100 UNIT/ML injection  Commonly known as:  novoLOG  Inject 5 Units into the skin. For CBG>250     insulin glargine 100 UNIT/ML injection  Commonly known as:  LANTUS  Inject into the skin. Inject 18 units subcutaneously every night at bedtime * do not mix with other insulins *     ipratropium 0.02 % nebulizer solution  Commonly  known as:  ATROVENT  Take 2.5 mLs (0.5 mg total) by nebulization every 4 (four) hours as needed.     NAMENDA XR 28 MG Cp24 24 hr capsule  Generic drug:  memantine  Take 1 capsule by mouth daily.     NUTRITIONAL SHAKE PO  Take by mouth. Mighty shake three times daily with med pass 10a, 2p, 8p     prednisoLONE acetate 1 % ophthalmic suspension  Commonly known as:  PRED FORTE  Place 1 drop into the left eye daily after breakfast.     PROCEL Powd  Take by mouth. Give 1 scoop twice daily for nutritional support and to increase protein     sennosides-docusate sodium 8.6-50 MG tablet  Commonly known as:  SENOKOT-S  Take 2 tablets by mouth. Take 2 tablets by mouth every night at bedtime for constipation        Physical exam BP 122/89 mmHg  Pulse 91  Temp(Src) 98.5 F (36.9 C) (Oral)  Resp 16  Wt 121 lb (54.885 kg)  SpO2 98%  Wt Readings from Last 3 Encounters:  03/29/15 121 lb (54.885 kg)  01/29/15 117 lb 12.8 oz (53.434 kg)    01/08/15 117 lb 3.2 oz (53.162 kg)   General- elderly frail female in no acute distress Head- atraumatic, normocephalic Eyes- legally blind, no pallor, no icterus Neck- no lymphadenopathy, no thyromegaly, no jugular vein distension Cardiovascular- normal s1,s2, no murmurs Respiratory- bilateral clear to auscultation, no wheeze, no rhonchi, no crackles Abdomen- bowel sounds present, soft, non tender Musculoskeletal- able to move her upper extremities, unable to move her LE. No leg edema. on wheelchair Neurological- non verbal, unable to assess   Labs CBC Latest Ref Rng 09/21/2014 05/26/2014 05/31/2011  WBC - 4.9 5.1 5.9  Hemoglobin 12.0 - 16.0 g/dL 92.113.9 19.412.9 17.412.8  Hematocrit 36 - 46 % 41 40 37.5  Platelets 150 - 399 K/L 264 237 242   CMP Latest Ref Rng 09/21/2014 05/26/2014 06/01/2011  Glucose 70 - 99 mg/dL - - 90  BUN 4 - 21 mg/dL 14 13 6   Creatinine 0.5 - 1.1 mg/dL 0.6 0.7 0.810.61  Sodium 448137 - 147 mmol/L 141 - 143  Potassium 3.4 - 5.3 mmol/L 4.4 - 3.7  Chloride 96 - 112 mEq/L - - 110  CO2 19 - 32 mEq/L - - 22  Calcium 8.4 - 10.5 mg/dL - - 9.5  Total Protein 6.0 - 8.3 g/dL - - -  Total Bilirubin 0.3 - 1.2 mg/dL - - -  Alkaline Phos 25 - 125 U/L 79 - -  AST 13 - 35 U/L 17 - -  ALT 7 - 35 U/L 10 - -   12/29/14 a1c 8.9 09/29/14 a1c 9 07/10/14 ABI- moderate RLE PAD  Assessment/plan  alzhimer's dementia With increased sleepiness during daytime. She is under total care. Will attempt to decrease her razadyne to 15 mg po daily for now and reassess. Continue namenda xr 28 mg daily for now. Monitor clinically. Check cbc and cmp  Diabetes mellitus Improved cbg. Continue lantus 18 u, check a1c. Continue to monitor cbg. Continue novolog 5 u bid for cbg > 250. continue baby aspirin  Protein calorie malnutrition Improved weight, continue fortified food, procel with mighty shake, monitor weight. Assistance with feeding.  Constipation Stable, continue senokot s 2 tab qhs  Recurrent uti Monitor  hydration, continue cranberry supplement  Oneal GroutMAHIMA Pratham Cassatt, MD  [facility] [phone] (Monday-Friday 8 am - 5 pm) [phone] (afterhours)

## 2015-04-08 ENCOUNTER — Emergency Department (HOSPITAL_COMMUNITY): Payer: Medicare Other

## 2015-04-08 ENCOUNTER — Inpatient Hospital Stay (HOSPITAL_COMMUNITY)
Admission: EM | Admit: 2015-04-08 | Discharge: 2015-04-13 | DRG: 871 | Payer: Medicare Other | Attending: Internal Medicine | Admitting: Internal Medicine

## 2015-04-08 ENCOUNTER — Encounter (HOSPITAL_COMMUNITY): Payer: Self-pay | Admitting: Emergency Medicine

## 2015-04-08 DIAGNOSIS — Z66 Do not resuscitate: Secondary | ICD-10-CM | POA: Diagnosis present

## 2015-04-08 DIAGNOSIS — R509 Fever, unspecified: Secondary | ICD-10-CM

## 2015-04-08 DIAGNOSIS — Z515 Encounter for palliative care: Secondary | ICD-10-CM | POA: Diagnosis not present

## 2015-04-08 DIAGNOSIS — I959 Hypotension, unspecified: Secondary | ICD-10-CM | POA: Diagnosis present

## 2015-04-08 DIAGNOSIS — R32 Unspecified urinary incontinence: Secondary | ICD-10-CM | POA: Diagnosis present

## 2015-04-08 DIAGNOSIS — I129 Hypertensive chronic kidney disease with stage 1 through stage 4 chronic kidney disease, or unspecified chronic kidney disease: Secondary | ICD-10-CM | POA: Diagnosis present

## 2015-04-08 DIAGNOSIS — T6594XA Toxic effect of unspecified substance, undetermined, initial encounter: Secondary | ICD-10-CM | POA: Diagnosis present

## 2015-04-08 DIAGNOSIS — R7881 Bacteremia: Secondary | ICD-10-CM | POA: Diagnosis not present

## 2015-04-08 DIAGNOSIS — A4150 Gram-negative sepsis, unspecified: Principal | ICD-10-CM | POA: Diagnosis present

## 2015-04-08 DIAGNOSIS — N183 Chronic kidney disease, stage 3 (moderate): Secondary | ICD-10-CM | POA: Diagnosis present

## 2015-04-08 DIAGNOSIS — F0390 Unspecified dementia without behavioral disturbance: Secondary | ICD-10-CM | POA: Diagnosis present

## 2015-04-08 DIAGNOSIS — R748 Abnormal levels of other serum enzymes: Secondary | ICD-10-CM | POA: Diagnosis present

## 2015-04-08 DIAGNOSIS — E119 Type 2 diabetes mellitus without complications: Secondary | ICD-10-CM

## 2015-04-08 DIAGNOSIS — E876 Hypokalemia: Secondary | ICD-10-CM | POA: Diagnosis present

## 2015-04-08 DIAGNOSIS — E1122 Type 2 diabetes mellitus with diabetic chronic kidney disease: Secondary | ICD-10-CM | POA: Diagnosis present

## 2015-04-08 DIAGNOSIS — R1310 Dysphagia, unspecified: Secondary | ICD-10-CM | POA: Diagnosis present

## 2015-04-08 DIAGNOSIS — E870 Hyperosmolality and hypernatremia: Secondary | ICD-10-CM | POA: Diagnosis not present

## 2015-04-08 DIAGNOSIS — Z7401 Bed confinement status: Secondary | ICD-10-CM

## 2015-04-08 DIAGNOSIS — G92 Toxic encephalopathy: Secondary | ICD-10-CM | POA: Diagnosis present

## 2015-04-08 DIAGNOSIS — G309 Alzheimer's disease, unspecified: Secondary | ICD-10-CM | POA: Diagnosis present

## 2015-04-08 DIAGNOSIS — E46 Unspecified protein-calorie malnutrition: Secondary | ICD-10-CM | POA: Diagnosis present

## 2015-04-08 DIAGNOSIS — H548 Legal blindness, as defined in USA: Secondary | ICD-10-CM | POA: Insufficient documentation

## 2015-04-08 DIAGNOSIS — L89301 Pressure ulcer of unspecified buttock, stage 1: Secondary | ICD-10-CM | POA: Diagnosis present

## 2015-04-08 DIAGNOSIS — G929 Unspecified toxic encephalopathy: Secondary | ICD-10-CM | POA: Diagnosis present

## 2015-04-08 DIAGNOSIS — Z993 Dependence on wheelchair: Secondary | ICD-10-CM

## 2015-04-08 DIAGNOSIS — B961 Klebsiella pneumoniae [K. pneumoniae] as the cause of diseases classified elsewhere: Secondary | ICD-10-CM | POA: Diagnosis present

## 2015-04-08 DIAGNOSIS — F0280 Dementia in other diseases classified elsewhere without behavioral disturbance: Secondary | ICD-10-CM | POA: Diagnosis present

## 2015-04-08 DIAGNOSIS — A419 Sepsis, unspecified organism: Secondary | ICD-10-CM | POA: Diagnosis present

## 2015-04-08 DIAGNOSIS — Z87891 Personal history of nicotine dependence: Secondary | ICD-10-CM | POA: Diagnosis not present

## 2015-04-08 DIAGNOSIS — N179 Acute kidney failure, unspecified: Secondary | ICD-10-CM | POA: Diagnosis present

## 2015-04-08 DIAGNOSIS — I10 Essential (primary) hypertension: Secondary | ICD-10-CM | POA: Diagnosis present

## 2015-04-08 DIAGNOSIS — E872 Acidosis, unspecified: Secondary | ICD-10-CM | POA: Diagnosis present

## 2015-04-08 DIAGNOSIS — N390 Urinary tract infection, site not specified: Secondary | ICD-10-CM | POA: Diagnosis present

## 2015-04-08 DIAGNOSIS — N189 Chronic kidney disease, unspecified: Secondary | ICD-10-CM | POA: Diagnosis present

## 2015-04-08 DIAGNOSIS — L8990 Pressure ulcer of unspecified site, unspecified stage: Secondary | ICD-10-CM | POA: Insufficient documentation

## 2015-04-08 LAB — URINALYSIS, ROUTINE W REFLEX MICROSCOPIC
GLUCOSE, UA: 100 mg/dL — AB
KETONES UR: 15 mg/dL — AB
Nitrite: POSITIVE — AB
PROTEIN: 100 mg/dL — AB
Specific Gravity, Urine: 1.022 (ref 1.005–1.030)
pH: 5.5 (ref 5.0–8.0)

## 2015-04-08 LAB — URINE MICROSCOPIC-ADD ON

## 2015-04-08 LAB — RAPID URINE DRUG SCREEN, HOSP PERFORMED
AMPHETAMINES: NOT DETECTED
Barbiturates: NOT DETECTED
Benzodiazepines: NOT DETECTED
Cocaine: NOT DETECTED
OPIATES: NOT DETECTED
Tetrahydrocannabinol: NOT DETECTED

## 2015-04-08 LAB — PROTIME-INR
INR: 1.25 (ref 0.00–1.49)
PROTHROMBIN TIME: 15.8 s — AB (ref 11.6–15.2)

## 2015-04-08 LAB — I-STAT CG4 LACTIC ACID, ED
LACTIC ACID, VENOUS: 8.17 mmol/L — AB (ref 0.5–2.0)
LACTIC ACID, VENOUS: 4.81 mmol/L — AB (ref 0.5–2.0)
LACTIC ACID, VENOUS: 4.89 mmol/L — AB (ref 0.5–2.0)

## 2015-04-08 LAB — PROCALCITONIN: PROCALCITONIN: 101.4 ng/mL

## 2015-04-08 LAB — COMPREHENSIVE METABOLIC PANEL
ALBUMIN: 2.6 g/dL — AB (ref 3.5–5.0)
ALK PHOS: 83 U/L (ref 38–126)
ALT: 94 U/L — ABNORMAL HIGH (ref 14–54)
ANION GAP: 13 (ref 5–15)
AST: 184 U/L — AB (ref 15–41)
BILIRUBIN TOTAL: 0.3 mg/dL (ref 0.3–1.2)
BUN: 16 mg/dL (ref 6–20)
CALCIUM: 8.9 mg/dL (ref 8.9–10.3)
CO2: 20 mmol/L — AB (ref 22–32)
Chloride: 108 mmol/L (ref 101–111)
Creatinine, Ser: 1.62 mg/dL — ABNORMAL HIGH (ref 0.44–1.00)
GFR calc Af Amer: 31 mL/min — ABNORMAL LOW (ref >60)
GFR calc non Af Amer: 27 mL/min — ABNORMAL LOW (ref >60)
GLUCOSE: 238 mg/dL — AB (ref 65–99)
POTASSIUM: 3.2 mmol/L — AB (ref 3.5–5.1)
SODIUM: 141 mmol/L (ref 135–145)
Total Protein: 6.3 g/dL — ABNORMAL LOW (ref 6.5–8.1)

## 2015-04-08 LAB — CBC WITH DIFFERENTIAL/PLATELET
BASOS ABS: 0 10*3/uL (ref 0.0–0.1)
BASOS PCT: 0 %
EOS ABS: 0 10*3/uL (ref 0.0–0.7)
EOS PCT: 0 %
HCT: 37.2 % (ref 36.0–46.0)
HEMOGLOBIN: 12.4 g/dL (ref 12.0–15.0)
LYMPHS ABS: 0.6 10*3/uL — AB (ref 0.7–4.0)
Lymphocytes Relative: 3 %
MCH: 30.6 pg (ref 26.0–34.0)
MCHC: 33.3 g/dL (ref 30.0–36.0)
MCV: 91.9 fL (ref 78.0–100.0)
Monocytes Absolute: 0.9 10*3/uL (ref 0.1–1.0)
Monocytes Relative: 4 %
NEUTROS PCT: 93 %
Neutro Abs: 19 10*3/uL — ABNORMAL HIGH (ref 1.7–7.7)
PLATELETS: 197 10*3/uL (ref 150–400)
RBC: 4.05 MIL/uL (ref 3.87–5.11)
RDW: 13 % (ref 11.5–15.5)
WBC: 20.5 10*3/uL — AB (ref 4.0–10.5)

## 2015-04-08 LAB — MRSA PCR SCREENING: MRSA by PCR: NEGATIVE

## 2015-04-08 LAB — I-STAT TROPONIN, ED: TROPONIN I, POC: 0 ng/mL (ref 0.00–0.08)

## 2015-04-08 LAB — APTT: APTT: 28 s (ref 24–37)

## 2015-04-08 LAB — GLUCOSE, CAPILLARY: GLUCOSE-CAPILLARY: 236 mg/dL — AB (ref 65–99)

## 2015-04-08 MED ORDER — INSULIN ASPART 100 UNIT/ML ~~LOC~~ SOLN
0.0000 [IU] | Freq: Three times a day (TID) | SUBCUTANEOUS | Status: DC
Start: 2015-04-09 — End: 2015-04-13
  Administered 2015-04-09: 2 [IU] via SUBCUTANEOUS
  Administered 2015-04-09 – 2015-04-11 (×2): 3 [IU] via SUBCUTANEOUS
  Administered 2015-04-11 – 2015-04-13 (×3): 2 [IU] via SUBCUTANEOUS
  Filled 2015-04-08: qty 0.15

## 2015-04-08 MED ORDER — PIPERACILLIN-TAZOBACTAM IN DEX 2-0.25 GM/50ML IV SOLN
2.2500 g | Freq: Three times a day (TID) | INTRAVENOUS | Status: DC
  Administered 2015-04-08 – 2015-04-11 (×8): 2.25 g via INTRAVENOUS
  Filled 2015-04-08 (×12): qty 50

## 2015-04-08 MED ORDER — ACETAMINOPHEN 650 MG RE SUPP
650.0000 mg | Freq: Four times a day (QID) | RECTAL | Status: DC | PRN
  Administered 2015-04-08 – 2015-04-13 (×3): 650 mg via RECTAL
  Filled 2015-04-08 (×3): qty 1

## 2015-04-08 MED ORDER — VANCOMYCIN HCL IN DEXTROSE 1-5 GM/200ML-% IV SOLN
1000.0000 mg | Freq: Once | INTRAVENOUS | Status: AC
  Administered 2015-04-08: 1000 mg via INTRAVENOUS
  Filled 2015-04-08: qty 200

## 2015-04-08 MED ORDER — CHLORHEXIDINE GLUCONATE 0.12 % MT SOLN
15.0000 mL | Freq: Two times a day (BID) | OROMUCOSAL | Status: DC
  Administered 2015-04-08 – 2015-04-13 (×10): 15 mL via OROMUCOSAL
  Filled 2015-04-08 (×11): qty 15

## 2015-04-08 MED ORDER — SODIUM CHLORIDE 0.9 % IV BOLUS (SEPSIS)
1500.0000 mL | Freq: Once | INTRAVENOUS | Status: AC
  Administered 2015-04-08: 1500 mL via INTRAVENOUS

## 2015-04-08 MED ORDER — VANCOMYCIN HCL IN DEXTROSE 750-5 MG/150ML-% IV SOLN: 750.0000 mg | INTRAVENOUS | Status: DC

## 2015-04-08 MED ORDER — HYPROMELLOSE (GONIOSCOPIC) 2.5 % OP SOLN
1.0000 [drp] | Freq: Four times a day (QID) | OPHTHALMIC | Status: DC
  Administered 2015-04-08 – 2015-04-13 (×17): 1 [drp] via OPHTHALMIC
  Filled 2015-04-08 (×2): qty 15

## 2015-04-08 MED ORDER — ENOXAPARIN SODIUM 30 MG/0.3ML ~~LOC~~ SOLN
30.0000 mg | SUBCUTANEOUS | Status: DC
  Administered 2015-04-08 – 2015-04-12 (×5): 30 mg via SUBCUTANEOUS
  Filled 2015-04-08 (×7): qty 0.3

## 2015-04-08 MED ORDER — ONDANSETRON HCL 4 MG PO TABS: 4.0000 mg | ORAL_TABLET | Freq: Four times a day (QID) | ORAL | Status: DC | PRN

## 2015-04-08 MED ORDER — POTASSIUM CHLORIDE IN NACL 20-0.9 MEQ/L-% IV SOLN
INTRAVENOUS | Status: DC
  Administered 2015-04-08 – 2015-04-11 (×4): via INTRAVENOUS
  Filled 2015-04-08 (×11): qty 1000

## 2015-04-08 MED ORDER — CETYLPYRIDINIUM CHLORIDE 0.05 % MT LIQD
7.0000 mL | Freq: Two times a day (BID) | OROMUCOSAL | Status: DC
  Administered 2015-04-09 – 2015-04-12 (×8): 7 mL via OROMUCOSAL

## 2015-04-08 MED ORDER — ACETAMINOPHEN 325 MG PO TABS: 650.0000 mg | ORAL_TABLET | Freq: Four times a day (QID) | ORAL | Status: DC | PRN

## 2015-04-08 MED ORDER — PIPERACILLIN-TAZOBACTAM IN DEX 2-0.25 GM/50ML IV SOLN
2.2500 g | Freq: Three times a day (TID) | INTRAVENOUS | Status: DC
  Filled 2015-04-08 (×2): qty 50

## 2015-04-08 MED ORDER — SODIUM CHLORIDE 0.9 % IV BOLUS (SEPSIS)
1500.0000 mL | Freq: Once | INTRAVENOUS | Status: AC
Start: 2015-04-08 — End: 2015-04-08
  Administered 2015-04-08: 1500 mL via INTRAVENOUS

## 2015-04-08 MED ORDER — PIPERACILLIN-TAZOBACTAM 3.375 G IVPB 30 MIN
3.3750 g | Freq: Once | INTRAVENOUS | Status: AC
  Administered 2015-04-08: 3.375 g via INTRAVENOUS
  Filled 2015-04-08: qty 50

## 2015-04-08 MED ORDER — INSULIN ASPART 100 UNIT/ML ~~LOC~~ SOLN
0.0000 [IU] | Freq: Every day | SUBCUTANEOUS | Status: DC
Start: 2015-04-08 — End: 2015-04-13
  Administered 2015-04-08: 2 [IU] via SUBCUTANEOUS

## 2015-04-08 MED ORDER — ONDANSETRON HCL 4 MG/2ML IJ SOLN: 4.0000 mg | Freq: Four times a day (QID) | INTRAMUSCULAR | Status: DC | PRN

## 2015-04-08 NOTE — Progress Notes (Addendum)
ANTIBIOTIC CONSULT NOTE - INITIAL  Pharmacy Consult for vancomycin/Zosyn Indication: sepsis  Allergies  Allergen Reactions  . Ppd [Tuberculin Purified Protein Derivative]   . Tape     Patient Measurements:    Vital Signs: Temp: 99.9 F (37.7 C) (11/17 1040) Temp Source: Rectal (11/17 1040) BP: 105/62 mmHg (11/17 1019) Pulse Rate: 103 (11/17 1019) Intake/Output from previous day:   Intake/Output from this shift:    Labs: No results for input(s): WBC, HGB, PLT, LABCREA, CREATININE in the last 72 hours. CrCl cannot be calculated (Patient has no serum creatinine result on file.). No results for input(s): VANCOTROUGH, VANCOPEAK, VANCORANDOM, GENTTROUGH, GENTPEAK, GENTRANDOM, TOBRATROUGH, TOBRAPEAK, TOBRARND, AMIKACINPEAK, AMIKACINTROU, AMIKACIN in the last 72 hours.   Microbiology: No results found for this or any previous visit (from the past 720 hour(s)).  Medical History: Past Medical History  Diagnosis Date  . Alzheimer's disease   . Dementia   . Diabetes mellitus without mention of complication   . Endothelial corneal dystrophy   . Legal blindness, as defined in [place]   . Unspecified glaucoma   . Dysphagia   . Depressive disorder, not elsewhere classified   . Hypertension   . Chronic kidney disease, unspecified (HCC)   . Renal disorder   . Generalized pain   . Osteoporosis     Assessment: [age] yof from NH with AMS. Pharmacy consulted to dose vancomycin/Zosyn for sepsis. Afeb, LA 8.17, WBC 20.5. SCr 1.62 on admit, CrCl~17.  11/17 vanc>> 11/17 zosyn>>  Goal of Therapy:  Vancomycin trough level 15-20 mcg/ml  Plan:  Vanc 1g IV x1; then Vanc 750mg  IV q48h Zosyn 3.375g (30min inf) x1 in ED; the Zosyn 2.25g IV q8h Monitor clinical progress, c/s, renal function, abx plan/LOT VT@SS  as indicated  Babs BertinHaley Joshaua Epple, PharmD Clinical Pharmacist Pager [phone] 04/08/2015 11:05 AM

## 2015-04-08 NOTE — ED Provider Notes (Signed)
11:07 AM 30cc/kg bolus given for hypotension, elevated lactate and suspected sepsis.  Azalia BilisKevin Maryland Luppino, MD 04/08/15 [phone]

## 2015-04-08 NOTE — H&P (Signed)
[facility] History and Physical  Adrienne Luodna Diaz WUJ:[id number] DOB: January 23, 1924 DOA: 04/08/2015  Referring physician: Jimmey Ralphparker PCP: No primary care provider on file. [facility]  Chief Complaint: lethargic  HPI: Adrienne Bailey is a 81 y.o. female with a history of dementia, diabetes, chronic kidney disease, hypertension, legally blind [facility] presents to the emergency department from the facility with the chief complaint of altered mental status and hypotension. Workup in the emergency department revealed acute sepsis likely related to UTI.  Information is obtained from the chart as patient is quite demented and quite lethargic no family present. She was sent to the emergency department for increased lethargy and hypotension. Per chart EMS arrived to facility and systolic blood pressure was 60. She was given 700 mils IV fluids and her blood pressure improved. There is no report of recent cough fever chills diarrhea nausea vomiting. He said routine visit at facility 10 days ago indicates that she is a total care and has been sleeping a lot. Chart indicates her oral intake had been good. At that time her resident Orlene Plumna was decreased.  Workup in the emergency department reveals initial lactic acid 8.17, initial troponin negative, comprehensive metabolic panel reveals potassium of 3.2 creatinine 1.6 and glucose of 238, complete blood count with WBCs of 20.5. AST 184, ALT 94.  Upon presentation her blood pressure blood pressure 90/41, rectal temp 99.9 heart rate 96 W. first 21 oxygen saturation level 97% on 2 L.  In the emergency department she is given another 1500 mils of normal saline IV thank and Zosyn are initiated as well.    Review of Systems:  Unable to obtain from patient. Chart reviewed as indicated above. Systems negative except as indicated in history of present illness   Past Medical History  Diagnosis Date  .  Alzheimer's disease   . Dementia   . Diabetes mellitus without mention of complication   . Endothelial corneal dystrophy   . Legal blindness, as defined in [place]   . Unspecified glaucoma   . Dysphagia   . Depressive disorder, not elsewhere classified   . Hypertension   . Chronic kidney disease, unspecified (HCC)   . Renal disorder   . Generalized pain   . Osteoporosis    History reviewed. No pertinent past surgical history. Social History:  reports that she quit smoking about 43 years ago. Her smoking use included Cigarettes. She has never used smokeless tobacco. She reports that she does not drink alcohol or use illicit drugs. [facility] She is mostly wheelchair and bedbound. She is total care. Incontinent of urine and bowel. Very limited verbal. She is unable to make her wants and needs known Allergies  Allergen Reactions  . Ppd [Tuberculin Purified Protein Derivative]   . Tape     No family history on file. unable to obtain  Prior to Admission medications   Medication Sig Start Date End Date Taking? Authorizing Provider  acetaminophen (TYLENOL) 325 MG tablet Take 650 mg by mouth 2 (two) times daily. For pain     Yes Historical Provider, MD  amLODipine (NORVASC) 5 MG tablet Take 5 mg by mouth daily.   Yes Historical Provider, MD  aspirin 81 MG chewable tablet Chew 81 mg by mouth daily. Take 1 tablet by mouth daily   Yes Historical Provider, MD  calcium-vitamin D (OSCAL WITH D) 500-200 MG-UNIT per tablet Take 1 tablet by mouth 3 (three)  times daily.     Yes Historical Provider, MD  Cranberry 405 MG CAPS Take 405 mg by mouth 2 (two) times daily.     Yes Historical Provider, MD  dextrose (INSTA-GLUCOSE) 40 % GEL Take 1 Tube by mouth. If fingerstick blood sugar is less than 60 and resident can swallow give glucose gel and recheck  In 30 minutes and continue hypoglycemia.   Yes Historical Provider, MD  escitalopram (LEXAPRO) 20 MG tablet Take 20 mg by  mouth daily.   Yes Historical Provider, MD  galantamine (RAZADYNE ER) 16 MG 24 hr capsule Take 16 mg by mouth daily with breakfast.   Yes Historical Provider, MD  hydroxypropyl methylcellulose / hypromellose (ISOPTO TEARS / GONIOVISC) 2.5 % ophthalmic solution Place 1 drop into both eyes 4 (four) times daily.   Yes Historical Provider, MD  insulin aspart (NOVOLOG) 100 UNIT/ML injection Inject 5 Units into the skin daily as needed for high blood sugar. For CBG>250   Yes Historical Provider, MD  insulin glargine (LANTUS) 100 UNIT/ML injection Inject 18 Units into the skin at bedtime. * do not mix with other insulins *   Yes Historical Provider, MD  memantine (NAMENDA XR) 28 MG CP24 24 hr capsule Take 28 mg by mouth daily.   Yes Historical Provider, MD  Multiple Vitamins-Minerals (DECUBI-VITE PO) Take by mouth. 1 by mouth daily for wound healing   Yes Historical Provider, MD  Nutritional Supplements (NUTRITIONAL SHAKE PO) Take by mouth. Mighty shake three times daily with med pass 10a, 2p, 8p   Yes Historical Provider, MD  prednisoLONE acetate (PRED FORTE) 1 % ophthalmic suspension Place 1 drop into the left eye daily after breakfast.    Yes Historical Provider, MD  Protein (PROCEL) POWD Take by mouth. Give 1 scoop twice daily for nutritional support and to increase protein   Yes Historical Provider, MD  sennosides-docusate sodium (SENOKOT-S) 8.6-50 MG tablet Take 2 tablets by mouth at bedtime.    Yes Historical Provider, MD  albuterol (PROVENTIL) (5 MG/ML) 0.5% nebulizer solution Take 0.5 mLs (2.5 mg total) by nebulization every 4 (four) hours as needed for wheezing or shortness of breath. 06/01/11 05/31/12  Cristal Ford, MD  ipratropium (ATROVENT) 0.02 % nebulizer solution Take 2.5 mLs (0.5 mg total) by nebulization every 4 (four) hours as needed. 06/01/11 05/31/12  Cristal Ford, MD   Physical Exam: Filed Vitals:   04/08/15 1345 04/08/15 1400 04/08/15 1415 04/08/15 1430  BP: 119/70 107/66 110/66  94/77  Pulse: 89 95 99 101  Temp:      TempSrc:      Resp: 24 23 23 23   SpO2: 100% 99% 100% 99%    Wt Readings from Last 3 Encounters:  03/29/15 54.885 kg (121 lb)  01/29/15 53.434 kg (117 lb 12.8 oz)  01/08/15 53.162 kg (117 lb 3.2 oz)    General:  Appears slightly anxious but comfortable Eyes: PERRL, normal lids, irises & conjunctiva ENT: grossly normal hearing, lips & tongue, mucous membranes of her mouth are pink but dry Neck: no LAD, masses or thyromegaly Cardiovascular: RRR, no m/r/g. No LE edema. Pedal pulses present and palpable Respiratory: CTA bilaterally, no w/r/r. Normal respiratory effort.  Abdomen: Nondistended, ntnd, slightly forearm positive bowel sounds no guarding Skin: no rash or induration seen on limited exam Musculoskeletal: grossly poor tone BUE/BLE. Will resist repositioning with upper extremities. Psychiatric: grossly normal mood and affect, speech fluent and appropriate Neurologic: Nonverbal on admission. Will move all extremities spontaneously.  Labs on Admission:  Basic Metabolic Panel:  Recent Labs Lab 04/08/15 1044  NA 141  K 3.2*  CL 108  CO2 20*  GLUCOSE 238*  BUN 16  CREATININE 1.62*  CALCIUM 8.9   Liver Function Tests:  Recent Labs Lab 04/08/15 1044  AST 184*  ALT 94*  ALKPHOS 83  BILITOT 0.3  PROT 6.3*  ALBUMIN 2.6*   No results for input(s): LIPASE, AMYLASE in the last 168 hours. No results for input(s): AMMONIA in the last 168 hours. CBC:  Recent Labs Lab 04/08/15 1044  WBC 20.5*  NEUTROABS 19.0*  HGB 12.4  HCT 37.2  MCV 91.9  PLT 197   Cardiac Enzymes: No results for input(s): CKTOTAL, CKMB, CKMBINDEX, TROPONINI in the last 168 hours.  BNP (last 3 results) No results for input(s): BNP in the last 8760 hours.  ProBNP (last 3 results) No results for input(s): PROBNP in the last 8760 hours.  CBG: No results for input(s): GLUCAP in the last 168 hours.  Radiological Exams on Admission: Ct Head  Wo Contrast  04/08/2015  CLINICAL DATA:  Altered mental status, hypertension EXAM: CT HEAD WITHOUT CONTRAST TECHNIQUE: Contiguous axial images were obtained from the base of the skull through the vertex without intravenous contrast. COMPARISON:  05/27/2011 FINDINGS: No skull fracture is noted. Paranasal sinuses and mastoid air cells are unremarkable. No intracranial hemorrhage, mass effect or midline shift. No acute cortical infarction. No mass lesion is noted on this unenhanced scan. Stable atrophy and chronic white matter disease. Ventricular size is stable from prior exam. IMPRESSION: No acute intracranial abnormality. Stable atrophy and chronic white matter disease. Electronically Signed   By: Natasha Mead M.D.   On: 04/08/2015 12:04   Dg Chest Portable 1 View  04/08/2015  CLINICAL DATA:  Fever, sepsis, altered mental status EXAM: PORTABLE CHEST 1 VIEW COMPARISON:  05/27/2011 FINDINGS: Stable cardiomegaly and central vascular congestion. Similar low lung volumes with left basilar streaky opacities compatible with scarring. No significant interval change or new finding. No superimposed CHF or edema. Negative for pneumothorax. Trachea is midline. Atherosclerosis of the aorta. Degenerative changes noted of the spine. Prior cholecystectomy evident. IMPRESSION: Cardiomegaly without CHF Low volume exam with similar left basilar chronic atelectasis/ scarring. Electronically Signed   By: Judie Petit.  Shick M.D.   On: 04/08/2015 11:26    EKG: Pending  Assessment/Plan Principal Problem:   Sepsis (HCC) Active Problems:   Hypertension   Dementia   UTI (lower urinary tract infection)   Dysphagia   Toxic encephalopathy   Alzheimer's disease   Diabetes (HCC)   Protein-calorie malnutrition (HCC)   Lactic acidosis  #1. Sepsis. Secondary to urinary tract infection. Chest x-ray without infiltrate -Admit to step down -Follow sepsis protocol except will continue Vanco and Zosyn that was initiated in the emergency  department -Continue IV fluids -Blood cultures pending, urine culture pending -On admission blood pressure 112/72  #2. Toxic encephalopathy. Secondary to #1. History of same. -Baseline is basically noncommunicative but alert and able to sit in chair -See #1. -If no improvement consider CT.  #3. Urinary tract infection. -Away cultures -Recurrent -Vanco and Zosyn as noted above. -Monitor urine output as able  #4. Diabetes -Home medications include 18 units of Lantus and 5 units of NovoLog as needed.  -Hold these for now -Obtain hemoglobin A1c -Use sliding scale insulin for optimal control -Currently nothing by mouth secondary to #2 -Once more alert provide car modified diet  #5. Alzheimer/dementia. Chart review indicates patient's baseline is  able to sit up in a wheelchair but she is total care -See #2 -We'll hold home medications for now given #2 -Anticipate resuming when appropriate  #6. Hypertension. Blood pressure quite soft secondary to #1. -Home medications include Norvasc -We'll hold Norvasc     Code Status: full ED physician discussed with son at length. May want to follow-up DVT Prophylaxis: Family Communication: None present. Spoke to son on phone patrick [phone] Disposition Plan: back to facility  Time spent: 60 minutes  [facility]

## 2015-04-08 NOTE — ED Notes (Signed)
Vital signs stable. 

## 2015-04-08 NOTE — ED Notes (Signed)
Attempted report 

## 2015-04-08 NOTE — ED Notes (Signed)
[facility], was hypotensive 60/40, 120/80 after 700 ml NS, CBG 250, is at neuro baseline, was initially painfully responsive only

## 2015-04-08 NOTE — ED Provider Notes (Signed)
CSN: [id number]     Arrival date & time 04/08/15  1014 History   First MD Initiated Contact with Patient 04/08/15 1015     Chief Complaint  Patient presents with  . Hypotension   (Consider location/radiation/quality/duration/timing/severity/associated sxs/prior Treatment) HPI  Patient is an 81 year old female with history of dementia, DM, and CKD presenting from her nursing home this morning with hypotension to the 60s/40s and increased lethargy. History is limited as the patient is severely demented. Per report, patient's BP improved to 60/40 after of NS and she returned back to her baseline neurological status.   Per patient's nursing facility, [facility], she was found unresponsive in the bed this morning. Per their report, this is new for her. She was found to be hypotensive which was also a new problem for her. She has not had any recent fevers or illnesses. She did not get any of her medications this morning. At her baseline, she is able to sit up in a chair, but is otherwise wheelchair bound and is not able to ambulate. She will respond to her name and is able to answer some questions, but is not able to carry out a conversation.   Past Medical History  Diagnosis Date  . Alzheimer's disease   . Dementia   . Diabetes mellitus without mention of complication   . Endothelial corneal dystrophy   . Legal blindness, as defined in [place]   . Unspecified glaucoma   . Dysphagia   . Depressive disorder, not elsewhere classified   . Hypertension   . Chronic kidney disease, unspecified (HCC)   . Renal disorder   . Generalized pain   . Osteoporosis    History reviewed. No pertinent past surgical history. No family history on file. Social History  Substance Use Topics  . Smoking status: Former Smoker    Types: Cigarettes    Quit date: 05/29/1971  . Smokeless tobacco: Never Used  . Alcohol Use: No     Comment: not able to assess   OB History    No data  available     Review of Systems  Unable to perform ROS: Mental status change   Allergies  Ppd and Tape  Home Medications   Prior to Admission medications   Medication Sig Start Date End Date Taking? Authorizing Provider  acetaminophen (TYLENOL) 325 MG tablet Take 650 mg by mouth 2 (two) times daily. For pain     Yes Historical Provider, MD  aspirin 81 MG chewable tablet Chew 81 mg by mouth daily. Take 1 tablet by mouth daily   Yes Historical Provider, MD  calcium-vitamin D (OSCAL WITH D) 500-200 MG-UNIT per tablet Take 1 tablet by mouth 3 (three) times daily.     Yes Historical Provider, MD  escitalopram (LEXAPRO) 20 MG tablet Take 20 mg by mouth daily.   Yes Historical Provider, MD  hydroxypropyl methylcellulose / hypromellose (ISOPTO TEARS / GONIOVISC) 2.5 % ophthalmic solution Place 1 drop into both eyes 4 (four) times daily.   Yes Historical Provider, MD  insulin glargine (LANTUS) 100 UNIT/ML injection Inject 20 Units into the skin daily. * do not mix with other insulins *   Yes Historical Provider, MD  memantine (NAMENDA) 10 MG tablet Take 10 mg by mouth 2 (two) times daily.   Yes Historical Provider, MD  prednisoLONE acetate (PRED FORTE) 1 % ophthalmic suspension Place 1 drop into the left eye 4 (four) times daily.    Yes Historical  Provider, MD  albuterol (PROVENTIL) (5 MG/ML) 0.5% nebulizer solution Take 0.5 mLs (2.5 mg total) by nebulization every 4 (four) hours as needed for wheezing or shortness of breath. 06/01/11 05/31/12  Cristal Ford, MD  Cranberry 405 MG CAPS Take 405 mg by mouth 2 (two) times daily.      Historical Provider, MD  dextrose (INSTA-GLUCOSE) 40 % GEL Take 1 Tube by mouth. If fingerstick blood sugar is less than 60 and resident can swallow give glucose gel and recheck  In 30 minutes and continue hypoglycemia.    Historical Provider, MD  insulin aspart (NOVOLOG) 100 UNIT/ML injection Inject 8 Units into the skin 3 (three) times daily before meals. For CBG>150     Historical Provider, MD  ipratropium (ATROVENT) 0.02 % nebulizer solution Take 2.5 mLs (0.5 mg total) by nebulization every 4 (four) hours as needed. 06/01/11 05/31/12  Cristal Ford, MD  Multiple Vitamins-Minerals (DECUBI-VITE PO) Take by mouth. 1 by mouth daily for wound healing    Historical Provider, MD  Nutritional Supplements (NUTRITIONAL SHAKE PO) Take by mouth. Mighty shake three times daily with med pass 10a, 2p, 8p    Historical Provider, MD  Protein (PROCEL) POWD Take by mouth. Give 1 scoop twice daily for nutritional support and to increase protein    Historical Provider, MD  sennosides-docusate sodium (SENOKOT-S) 8.6-50 MG tablet Take 2 tablets by mouth. Take 2 tablets by mouth every night at bedtime for constipation    Historical Provider, MD   BP 94/77 mmHg  Pulse 101  Temp(Src) 99.9 F (37.7 C) (Rectal)  Resp 23  SpO2 99% Physical Exam  Constitutional: She appears lethargic. She appears ill. No distress.  HENT:  Head: Normocephalic and atraumatic.  Eyes:  Left eye with cataract. S/p right eye enucleation   Neck: Normal range of motion.  Cardiovascular: Regular rhythm and normal heart sounds.   No murmur heard. Pulmonary/Chest: Effort normal and breath sounds normal. No respiratory distress. She has no wheezes.  Abdominal: Soft. Bowel sounds are normal. She exhibits no distension. There is no tenderness.  Musculoskeletal: She exhibits no edema.  Neurological: She appears lethargic.  Exam limited by dementia/mental status. Does not follow commands. Withdraws all extremities to pain.   Skin: Skin is warm, dry and intact.  Nursing note and vitals reviewed.   ED Course  Procedures (including critical care time) Labs Review Labs Reviewed  CBC WITH DIFFERENTIAL/PLATELET - Abnormal; Notable for the following:    WBC 20.5 (*)    Neutro Abs 19.0 (*)    Lymphs Abs 0.6 (*)    All other components within normal limits  URINALYSIS, ROUTINE W REFLEX MICROSCOPIC (NOT AT [facility]) -  Abnormal; Notable for the following:    Color, Urine AMBER (*)    APPearance TURBID (*)    Glucose, UA 100 (*)    Hgb urine dipstick LARGE (*)    Bilirubin Urine MODERATE (*)    Ketones, ur 15 (*)    Protein, ur 100 (*)    Nitrite POSITIVE (*)    Leukocytes, UA LARGE (*)    All other components within normal limits  COMPREHENSIVE METABOLIC PANEL - Abnormal; Notable for the following:    Potassium 3.2 (*)    CO2 20 (*)    Glucose, Bld 238 (*)    Creatinine, Ser 1.62 (*)    Total Protein 6.3 (*)    Albumin 2.6 (*)    AST 184 (*)    ALT 94 (*)  GFR calc non Af Amer 27 (*)    GFR calc Af Amer 31 (*)    All other components within normal limits  URINE MICROSCOPIC-ADD ON - Abnormal; Notable for the following:    Squamous Epithelial / LPF 6-30 (*)    Bacteria, UA MANY (*)    Crystals CA OXALATE CRYSTALS (*)    All other components within normal limits  I-STAT CG4 LACTIC ACID, ED - Abnormal; Notable for the following:    Lactic Acid, Venous 8.17 (*)    All other components within normal limits  I-STAT CG4 LACTIC ACID, ED - Abnormal; Notable for the following:    Lactic Acid, Venous 4.89 (*)    All other components within normal limits  I-STAT CG4 LACTIC ACID, ED - Abnormal; Notable for the following:    Lactic Acid, Venous 4.81 (*)    All other components within normal limits  CULTURE, BLOOD (ROUTINE X 2)  CULTURE, BLOOD (ROUTINE X 2)  URINE RAPID DRUG SCREEN, HOSP PERFORMED  I-STAT TROPOININ, ED    Imaging Review Ct Head Wo Contrast  04/08/2015  CLINICAL DATA:  Altered mental status, hypertension EXAM: CT HEAD WITHOUT CONTRAST TECHNIQUE: Contiguous axial images were obtained from the base of the skull through the vertex without intravenous contrast. COMPARISON:  05/27/2011 FINDINGS: No skull fracture is noted. Paranasal sinuses and mastoid air cells are unremarkable. No intracranial hemorrhage, mass effect or midline shift. No acute cortical infarction. No mass lesion is  noted on this unenhanced scan. Stable atrophy and chronic white matter disease. Ventricular size is stable from prior exam. IMPRESSION: No acute intracranial abnormality. Stable atrophy and chronic white matter disease. Electronically Signed   By: Natasha MeadLiviu  Pop M.D.   On: 04/08/2015 12:04   Dg Chest Portable 1 View  04/08/2015  CLINICAL DATA:  Fever, sepsis, altered mental status EXAM: PORTABLE CHEST 1 VIEW COMPARISON:  05/27/2011 FINDINGS: Stable cardiomegaly and central vascular congestion. Similar low lung volumes with left basilar streaky opacities compatible with scarring. No significant interval change or new finding. No superimposed CHF or edema. Negative for pneumothorax. Trachea is midline. Atherosclerosis of the aorta. Degenerative changes noted of the spine. Prior cholecystectomy evident. IMPRESSION: Cardiomegaly without CHF Low volume exam with similar left basilar chronic atelectasis/ scarring. Electronically Signed   By: Judie PetitM.  Shick M.D.   On: 04/08/2015 11:26   I have personally reviewed and evaluated these images and lab results as part of my medical decision-making.   EKG Interpretation None      MDM   Final diagnoses:  None   10:50 AM Patient is an 81 year old female with history of dementia, DM, and CKD presenting from her nursing home facility with hypotension and altered mental status. Blood pressure and mental status improved after 700ml of NS. Exam with nonfocal neurological exam and no obvious signs of infection. Will check CBC, CMP, lactate, troponing, EKG, CXR, head CT, and UA.  11:04 AM Patient's lactate elevated to 8. WBC elevated to 20.5 Also with AKI noted. BP trending down. Will give 30cc/kg bolus and start broad spectrum antibiotics for presumed sepsis.   2:16 PM Patient's BP improved s/p bolus. Lactate trending down to 4.89. UA consistent with UTI. Discussed care with patient's son, he wishes for full aggressive care. Will call hospitalist for admission for sepsis  with urinary source.   Ardith Darkaleb M Malakai Schoenherr, MD 04/08/15 1443  Azalia BilisKevin Campos, MD 04/08/15 [phone]

## 2015-04-08 NOTE — Progress Notes (Signed)
CRITICAL VALUE ALERT  Critical value received:  Lactic Acid = 5.6  Date of notification:  04/08/2015  Time of notification:  2015  Critical value read back:Yes.    Nurse who received alert:  K. Vincenza HewsUssery  MD notified (1st page):  Tereso NewcomerKirby-Graham, NP  Time of first page:  2048  MD notified (2nd page):  Time of second page:  Responding MD:  Tereso NewcomerKirby-Graham, NP  Time MD responded:  2050  New orders received. Will carry out and continue to monitor.

## 2015-04-09 DIAGNOSIS — Z515 Encounter for palliative care: Secondary | ICD-10-CM

## 2015-04-09 DIAGNOSIS — F0390 Unspecified dementia without behavioral disturbance: Secondary | ICD-10-CM

## 2015-04-09 DIAGNOSIS — L8990 Pressure ulcer of unspecified site, unspecified stage: Secondary | ICD-10-CM | POA: Insufficient documentation

## 2015-04-09 DIAGNOSIS — R7881 Bacteremia: Secondary | ICD-10-CM

## 2015-04-09 LAB — GLUCOSE, CAPILLARY
GLUCOSE-CAPILLARY: 190 mg/dL — AB (ref 65–99)
GLUCOSE-CAPILLARY: 122 mg/dL — AB (ref 65–99)
Glucose-Capillary: 111 mg/dL — ABNORMAL HIGH (ref 65–99)
Glucose-Capillary: 129 mg/dL — ABNORMAL HIGH (ref 65–99)

## 2015-04-09 LAB — COMPREHENSIVE METABOLIC PANEL
ALBUMIN: 2.1 g/dL — AB (ref 3.5–5.0)
ALT: 60 U/L — AB (ref 14–54)
AST: 67 U/L — AB (ref 15–41)
Alkaline Phosphatase: 74 U/L (ref 38–126)
Anion gap: 5 (ref 5–15)
BUN: 19 mg/dL (ref 6–20)
CHLORIDE: 119 mmol/L — AB (ref 101–111)
CO2: 21 mmol/L — AB (ref 22–32)
Calcium: 7.7 mg/dL — ABNORMAL LOW (ref 8.9–10.3)
Creatinine, Ser: 1.36 mg/dL — ABNORMAL HIGH (ref 0.44–1.00)
GFR calc Af Amer: 39 mL/min — ABNORMAL LOW (ref >60)
GFR calc non Af Amer: 33 mL/min — ABNORMAL LOW (ref >60)
GLUCOSE: 149 mg/dL — AB (ref 65–99)
POTASSIUM: 3.9 mmol/L (ref 3.5–5.1)
SODIUM: 145 mmol/L (ref 135–145)
Total Bilirubin: 0.7 mg/dL (ref 0.3–1.2)
Total Protein: 5.6 g/dL — ABNORMAL LOW (ref 6.5–8.1)

## 2015-04-09 LAB — CBC
HEMATOCRIT: 30.9 % — AB (ref 36.0–46.0)
Hemoglobin: 10.3 g/dL — ABNORMAL LOW (ref 12.0–15.0)
MCH: 30 pg (ref 26.0–34.0)
MCHC: 33.3 g/dL (ref 30.0–36.0)
MCV: 90.1 fL (ref 78.0–100.0)
Platelets: 169 10*3/uL (ref 150–400)
RBC: 3.43 MIL/uL — ABNORMAL LOW (ref 3.87–5.11)
RDW: 13.6 % (ref 11.5–15.5)
WBC: 22.9 10*3/uL — ABNORMAL HIGH (ref 4.0–10.5)

## 2015-04-09 LAB — HEMOGLOBIN A1C
HEMOGLOBIN A1C: 8.9 % — AB (ref 4.8–5.6)
MEAN PLASMA GLUCOSE: 209 mg/dL

## 2015-04-09 LAB — LACTIC ACID, PLASMA
LACTIC ACID, VENOUS: 3.7 mmol/L — AB (ref 0.5–2.0)
Lactic Acid, Venous: 5.6 mmol/L (ref 0.5–2.0)

## 2015-04-09 NOTE — Progress Notes (Signed)
CRITICAL VALUE ALERT  Critical value received:  Lactic Acid = 3.7  Date of notification:  04/09/2015  Time of notification:  2330  Critical value read back:Yes.    Nurse who received alert:  K. Vincenza HewsUssery  MD notified (1st page):    Time of first page:    MD notified (2nd page):  Time of second page:  Responding MD:    Time MD responded:

## 2015-04-09 NOTE — Progress Notes (Signed)
PROGRESS NOTE  Adrienne Bailey HQI:[id number] DOB: May 18, 1924 DOA: 04/08/2015 PCP: No primary care provider on file.  Assessment/Plan: Sepsis with gram negative rod bacteremia. Secondary to urinary tract infection -Continue IV fluids -Blood cultures pending, urine culture pending  Toxic encephalopathy.  -Baseline is basically noncommunicative but alert and able to sit in chair  Urinary tract infection. -Away cultures- ordered 11/18 -Recurrent -Vanco and Zosyn as noted above. -Monitor urine output as able  Diabetes -Home medications include 18 units of Lantus and 5 units of NovoLog as needed.  -Hold these for now -Obtain hemoglobin A1c -Use sliding scale insulin for optimal control -Currently nothing by mouth secondary to #2 -Once more alert provide car modified diet  Alzheimer/dementia. Chart review indicates patient's baseline is able to sit up in a wheelchair but she is total care -discussed with son palliative care-- willing to speak about GOC  Hypertension.  -resume norvasc  Hypokalemia -replete through IVF  Elevated liver enzymes -trend  Code Status: full Family Communication: called son Disposition Plan:    Consultants:  Palliative care  Procedures:    HPI/Subjective: In bed, not speaking  Objective: Filed Vitals:   04/09/15 0400  BP: 140/63  Pulse: 88  Temp: 98.9 F (37.2 C)  Resp: 23    Intake/Output Summary (Last 24 hours) at 04/09/15 0745 Last data filed at 04/09/15 0514  Gross per 24 hour  Intake 3567.5 ml  Output      2 ml  Net 3565.5 ml   Filed Weights   04/08/15 1752  Weight: 56.7 kg (125 lb)    Exam:   General:  In bed, not speaking- withdraws to pain  Cardiovascular: rrr  Respiratory: clear  Abdomen: +BS, soft  Musculoskeletal: no edema  Data Reviewed: Basic Metabolic Panel:  Recent Labs Lab 04/08/15 1044  NA 141  K 3.2*  CL 108  CO2 20*  GLUCOSE 238*  BUN 16  CREATININE 1.62*  CALCIUM 8.9   Liver  Function Tests:  Recent Labs Lab 04/08/15 1044  AST 184*  ALT 94*  ALKPHOS 83  BILITOT 0.3  PROT 6.3*  ALBUMIN 2.6*   No results for input(s): LIPASE, AMYLASE in the last 168 hours. No results for input(s): AMMONIA in the last 168 hours. CBC:  Recent Labs Lab 04/08/15 1044  WBC 20.5*  NEUTROABS 19.0*  HGB 12.4  HCT 37.2  MCV 91.9  PLT 197   Cardiac Enzymes: No results for input(s): CKTOTAL, CKMB, CKMBINDEX, TROPONINI in the last 168 hours. BNP (last 3 results) No results for input(s): BNP in the last 8760 hours.  ProBNP (last 3 results) No results for input(s): PROBNP in the last 8760 hours.  CBG:  Recent Labs Lab 04/08/15 2122  GLUCAP 236*    Recent Results (from the past 240 hour(s))  Culture, blood (routine x 2)     Status: None (Preliminary result)   Collection Time: 04/08/15 11:11 AM  Result Value Ref Range Status   Specimen Description BLOOD RIGHT FOREARM  Final   Special Requests BOTTLES DRAWN AEROBIC AND ANAEROBIC  Final   Culture  Setup Time   Final    GRAM NEGATIVE RODS AEROBIC BOTTLE ONLY CRITICAL RESULT CALLED TO, READ BACK BY AND VERIFIED WITH: R BROWN  MKELLY 04/09/15    Culture PENDING  Incomplete   Report Status PENDING  Incomplete  Culture, blood (routine x 2)     Status: None (Preliminary result)   Collection Time: 04/08/15 11:35 AM  Result Value Ref Range Status  Specimen Description BLOOD LEFT HAND  Final   Special Requests BOTTLES DRAWN AEROBIC ONLY 8ML  Final   Culture PENDING  Incomplete   Report Status PENDING  Incomplete  MRSA PCR Screening     Status: None   Collection Time: 04/08/15  5:40 PM  Result Value Ref Range Status   MRSA by PCR NEGATIVE NEGATIVE Final    Comment:        The GeneXpert MRSA Assay (FDA approved for NASAL specimens only), is one component of a comprehensive MRSA colonization surveillance program. It is not intended to diagnose MRSA infection nor to guide or monitor treatment for MRSA  infections.      Studies: Ct Head Wo Contrast  04/08/2015  CLINICAL DATA:  Altered mental status, hypertension EXAM: CT HEAD WITHOUT CONTRAST TECHNIQUE: Contiguous axial images were obtained from the base of the skull through the vertex without intravenous contrast. COMPARISON:  05/27/2011 FINDINGS: No skull fracture is noted. Paranasal sinuses and mastoid air cells are unremarkable. No intracranial hemorrhage, mass effect or midline shift. No acute cortical infarction. No mass lesion is noted on this unenhanced scan. Stable atrophy and chronic white matter disease. Ventricular size is stable from prior exam. IMPRESSION: No acute intracranial abnormality. Stable atrophy and chronic white matter disease. Electronically Signed   By: Natasha MeadLiviu  Pop M.D.   On: 04/08/2015 12:04   Dg Chest Portable 1 View  04/08/2015  CLINICAL DATA:  Fever, sepsis, altered mental status EXAM: PORTABLE CHEST 1 VIEW COMPARISON:  05/27/2011 FINDINGS: Stable cardiomegaly and central vascular congestion. Similar low lung volumes with left basilar streaky opacities compatible with scarring. No significant interval change or new finding. No superimposed CHF or edema. Negative for pneumothorax. Trachea is midline. Atherosclerosis of the aorta. Degenerative changes noted of the spine. Prior cholecystectomy evident. IMPRESSION: Cardiomegaly without CHF Low volume exam with similar left basilar chronic atelectasis/ scarring. Electronically Signed   By: Judie PetitM.  Shick M.D.   On: 04/08/2015 11:26    Scheduled Meds: . antiseptic oral rinse  7 mL Mouth Rinse q12n4p  . chlorhexidine  15 mL Mouth Rinse BID  . enoxaparin (LOVENOX) injection  30 mg Subcutaneous Q24H  . hydroxypropyl methylcellulose / hypromellose  1 drop Both Eyes QID  . insulin aspart  0-15 Units Subcutaneous TID WC  . insulin aspart  0-5 Units Subcutaneous QHS  . piperacillin-tazobactam (ZOSYN)  IV  2.25 g Intravenous 3 times per day  . [START ON 04/10/2015] vancomycin  750  mg Intravenous Q48H   Continuous Infusions: . 0.9 % NaCl with KCl 20 mEq / L 75 mL/hr at 04/08/15 1846   Antibiotics Given (last 72 hours)    Date/Time Action Medication Dose Rate   04/08/15 1949 Given   piperacillin-tazobactam (ZOSYN) IVPB 2.25 g 2.25 g 100 mL/hr   04/09/15 0514 Given   piperacillin-tazobactam (ZOSYN) IVPB 2.25 g 2.25 g 100 mL/hr      Principal Problem:   Sepsis (HCC) Active Problems:   Hypertension   Dementia   UTI (lower urinary tract infection)   Dysphagia   Toxic encephalopathy   Alzheimer's disease   Diabetes (HCC)   Protein-calorie malnutrition (HCC)   Lactic acidosis   Pressure ulcer    Time spent: 35 min    Tryniti Laatsch [facility]  [facility] Pager [phone] If 7PM-7AM, please contact night-coverage at [email or web address], password [email or web address] 04/09/2015, 7:45 AM  LOS: 1 day

## 2015-04-09 NOTE — Progress Notes (Addendum)
Palliative:  Full note to follow. Adrienne Bailey is lying in bed. Vital signs stable. She is minimally responsive - only some grimacing with tactile stimulation, did not verbalize, did not open eyes. I did speak with her son, Adrienne Bailey. He tells me that she seems "okay" to him. He confirms that she is mostly bedbound in SNF (he contributes this to her blindness), has pureed diet (says she has no dentures/teeth), and she incontinent. He says that sometimes she will verbally respond (confused) but sometimes not but that she does like to hum music. He understands that she has dementia and we discuss the disease trajectory of dementia.  In our discussion of her QOL he says that she is [name] and [place] lived in the [place] now (mostly in [place] She was a "social butterfly" always active and loved music and having fun. Unfortunately she became blind ~2000 and now her QOL is very declined from what she used too be. He tells me that he wants everything that can be done to "keep her alive." However he did agree that if her heart/lungs fail and in the process of dying we should not try to "bring her back" - DNR confirmed. No other limitations in care - otherwise desires aggressive care despite poor QOL. Adrienne Bailey understands she will not live forever and is 81 yo but is optimistic she will "pull through this time but I know she may not with the next UTI or the next.   Will follow up on Monday. Please call for any acute palliative needs over the weekend.   Adrienne ChannelAlicia Rhaelyn Giron, NP Palliative Medicine Team Pager # [phone] (M-F 8a-5p) Team Phone # [phone] (Nights/Weekends)

## 2015-04-09 NOTE — Evaluation (Signed)
Clinical/Bedside Swallow Evaluation Patient Details  Name: Adrienne Bailey MRN: [id number] Date of Birth: 15-Mar-1924  Today's Date: 04/09/2015 Time: SLP Start Time (ACUTE ONLY): 1415 SLP Stop Time (ACUTE ONLY): 1435 SLP Time Calculation (min) (ACUTE ONLY): 20 min  Past Medical History:  Past Medical History  Diagnosis Date  . Alzheimer's disease   . Dementia   . Diabetes mellitus without mention of complication   . Endothelial corneal dystrophy   . Legal blindness, as defined in [place]   . Unspecified glaucoma   . Dysphagia   . Depressive disorder, not elsewhere classified   . Hypertension   . Chronic kidney disease, unspecified (HCC)   . Renal disorder   . Generalized pain   . Osteoporosis    Past Surgical History: History reviewed. No pertinent past surgical history. HPI:  Patient is an 81 y.o. female with a Past Medical History ofdementia, DM, CKD, HTN who presents with AMS and found to have sepsis in the setting of a UTI. Workup in the ED is pertinent for acute on chronic renal failure, elevated lactic acid, elevated WBC and a positive UA.   Assessment / Plan / Recommendation Clinical Impression  Patient presents with a severe oral and pharyngeal dysphagia with impact from cognitive impairment (h/o dementia). Per RN, she apparently was on puree solids and thin liquids at SNF prior to this admission. This SLP assessed patient with boluses of puree solids, thin liquids, ice chip. Patient moved purees and liquids around in mouth, but did not initiate a swallow. SLP observed laryngeal pumping per palpation, but no pharyngeal contraction and hyo-laryngeal elevation was severely limited. SLP suctioned out entire bolus of puree solids from oral cavity, and suspect thin liquids were either absorbed or transisted pharyngeally without swallow initiation. No overt s/s aspiration , such as coughing, throat clearing and patient did not appear to be in distress. She moved tongue to clean lips when  small amount of puree was left, but overall lingual ROM appeared to be significantly impaired.     Aspiration Risk  Severe aspiration risk;Risk for inadequate nutrition/hydration    Diet Recommendation     Medication Administration: Via alternative means    Other  Recommendations Oral Care Recommendations: Oral care BID   Follow up Recommendations       Frequency and Duration min 3x week  2 weeks       Swallow Study   General Date of Onset: 04/08/15 HPI: Patient is an 81 y.o. female with a Past Medical History ofdementia, DM, CKD, HTN who presents with AMS and found to have sepsis in the setting of a UTI. Workup in the ED is pertinent for acute on chronic renal failure, elevated lactic acid, elevated WBC and a positive UA. Type of Study: Bedside Swallow Evaluation Previous Swallow Assessment: N/A Diet Prior to this Study: NPO Temperature Spikes Noted: No Respiratory Status: Room air History of Recent Intubation: No Behavior/Cognition: Requires cueing;Doesn't follow directions;Lethargic/Drowsy Oral Cavity Assessment: Other (comment) (no significant oral residuals noted, although patient did not allow SLP to perform full assessment) Oral Care Completed by SLP: Yes Oral Cavity - Dentition: Poor condition;Edentulous;Other (Comment) (patient only has a few bottom teeth) Vision:  (patient did not open eyes) Self-Feeding Abilities: Total assist Patient Positioning: Upright in bed Baseline Vocal Quality: Not observed Volitional Cough: Cognitively unable to elicit Volitional Swallow: Unable to elicit    Oral/Motor/Sensory Function Overall Oral Motor/Sensory Function: Moderate impairment Lingual ROM: Reduced right;Reduced left Lingual Strength: Reduced   Ice Chips Ice  chips: Impaired Presentation: Spoon Oral Phase Impairments: Reduced labial seal;Reduced lingual movement/coordination;Impaired mastication;Poor awareness of bolus Oral Phase Functional Implications: Prolonged oral  transit;Oral holding Pharyngeal Phase Impairments: Decreased hyoid-laryngeal movement (laryngeal pumping but no swallow initiated)   Thin Liquid Thin Liquid: Impaired Presentation: Spoon Oral Phase Impairments: Poor awareness of bolus;Reduced lingual movement/coordination;Reduced labial seal Oral Phase Functional Implications: Prolonged oral transit;Oral holding Pharyngeal  Phase Impairments: Decreased hyoid-laryngeal movement;Other (comments) (laryngeal pumping but no active swallow initiated)    Nectar Thick Nectar Thick Liquid: Not tested   Honey Thick Honey Thick Liquid: Not tested   Puree Puree: Impaired Presentation: Spoon Oral Phase Impairments: Reduced labial seal;Reduced lingual movement/coordination;Poor awareness of bolus Oral Phase Functional Implications: Prolonged oral transit;Oral holding Pharyngeal Phase Impairments: Decreased hyoid-laryngeal movement (laryngeal pumping but no active swallow initiated)   Solid Solid: Not tested       Adrienne Bailey 04/09/2015,2:58 PM    Angela Nevin, MA, CCC-SLP 04/09/2015 2:58 PM

## 2015-04-09 NOTE — Clinical Documentation Improvement (Signed)
Internal Medicine  Can the diagnosis of Malnutrition be further specified?   Document Severity - Severe(third degree), Moderate (second degree), Mild (first degree)  Other condition  Unable to clinically determine  Document any associated diagnoses/conditions   Supporting Information: Protein calorie malnutrition currently documented;  Nursing flowsheets reflect height 5 feet 2 inches, weight 125 pounds, BMI 22.9.     Please exercise your independent, professional judgment when responding. A specific answer is not anticipated or expected.Please update your documentation within the medical record to reflect your response to this query.  Thank you, Doy MinceVangela Jeziah Kretschmer, RN [phone] Clinical Documentation Specialist

## 2015-04-09 NOTE — Progress Notes (Signed)
Initial Nutrition Assessment  DOCUMENTATION CODES:   Not applicable  INTERVENTION:    Diet advancement as able per MD/SLP; RD to add PO supplements as needed when diet is advanced.  NUTRITION DIAGNOSIS:   Inadequate oral intake related to inability to eat as evidenced by NPO status.  GOAL:   Patient will meet greater than or equal to 90% of their needs  MONITOR:   Diet advancement, PO intake  REASON FOR ASSESSMENT:   Malnutrition Screening Tool    ASSESSMENT:   81 y.o. female with a history of dementia, diabetes, chronic kidney disease, hypertension, legally blind [facility] to the emergency department from the facility with the chief complaint of altered mental status and hypotension. Workup in the emergency department revealed acute sepsis likely related to UTI.  Nutrition-Focused physical exam completed. Findings are no fat depletion and mild-moderate muscle depletion. Patient unable to provide any nutrition hx. Per discussion with RN, patient to have a swallow evaluation with SLP today. She is currently NPO. Palliative Care Team has been consulted. She is at nutrition risk given NPO status and increased needs for wound healing.   Diet Order:  Diet NPO time specified  Skin:  Wound (see comment) (stage I pressure injury to buttocks)  Last BM:  unknown  Height:   Ht Readings from Last 1 Encounters:  04/08/15 5\' 2"  (1.575 m)    Weight:   Wt Readings from Last 1 Encounters:  04/08/15 125 lb (56.7 kg)    Ideal Body Weight:  50 kg  BMI:  Body mass index is 22.86 kg/(m^2).  Estimated Nutritional Needs:   Kcal:  1400-1600  Protein:  70-85 gm  Fluid:  >/= 1.5 L  EDUCATION NEEDS:   No education needs identified at this time  Joaquin CourtsKimberly Araiyah Cumpton, RD, LDN, CNSC Pager [phone] After Hours Pager [phone]

## 2015-04-09 NOTE — Progress Notes (Signed)
CRITICAL VALUE ALERT  Critical value received:  Positive Blood Cultures - Gram negative rods in aerobic bottle  Date of notification:  04/09/2015  Time of notification:  0700  Critical value read back:Yes.    Nurse who received alert:  K. Vincenza HewsUssery  MD notified (1st page):    Time of first page:    MD notified (2nd page):  Time of second page:  Responding MD:    Time MD responded:

## 2015-04-09 NOTE — Consult Note (Signed)
Consultation Note Date: 04/09/2015   Patient Name: Adrienne Bailey  DOB: August 13, 1923  MRN: [id number]  Age / Sex: 81 y.o., female  PCP: No primary care provider on file. Referring Physician: Joseph ArtJessica U Vann, DO  Reason for Consultation: Establishing goals of care    Clinical Assessment/Narrative: Ms. Adrienne Bailey is lying in bed. Vital signs stable. She is minimally responsive - only some grimacing with tactile stimulation, did not verbalize, did not open eyes. I did speak with her son, Luisa Hartatrick. He tells me that she seems "okay" to him. He confirms that she is mostly bedbound in SNF (he contributes this to her blindness), has pureed diet (says she has no dentures/teeth), and she incontinent. He says that sometimes she will verbally respond (confused) but sometimes not but that she does like to hum music. He understands that she has dementia and we discuss the disease trajectory of dementia.  In our discussion of her QOL he says that she is Adrienne Bailey and has lived in the [place] ~[place] years now (mostly in [place] She was a "social butterfly" always active and loved music and having fun. Unfortunately she became blind ~2000 and now her QOL is very declined from what she used too be. He tells me that he wants everything that can be done to "keep her alive." However he did agree that if her heart/lungs fail and in the process of dying we should not try to "bring her back" - DNR confirmed. No other limitations in care - otherwise desires aggressive care despite poor QOL. Luisa Hartatrick understands she will not live forever and is 81 yo but is optimistic she will "pull through this time but I know she may not with the next UTI or the next.   Contacts/Participants in Discussion: Primary Decision Maker: Carlyon ProwsPatrick Morgan   Relationship to Patient Son   SUMMARY OF RECOMMENDATIONS  Code Status/Advance Care Planning: DNR    Code Status Orders          Start     Ordered   04/09/15 1558  Do not attempt resuscitation (DNR)   Continuous    Question Answer Comment  In the event of cardiac or respiratory ARREST Do not call a "code blue"   In the event of cardiac or respiratory ARREST Do not perform Intubation, CPR, defibrillation or ACLS   In the event of cardiac or respiratory ARREST Use medication by any route, position, wound care, and other measures to relive pain and suffering. May use oxygen, suction and manual treatment of airway obstruction as needed for comfort.      04/09/15 1557       Symptom Management:   No symptoms at this time. Concerned about dysphagia.   Palliative Prophylaxis:   Bowel Regimen, Oral Care and Turn Reposition  Additional Recommendations (Limitations, Scope, Preferences):  Full Scope Treatment outside DNR  Psycho-social/Spiritual:  Support System: Adequate Desire for further Chaplaincy support:no Additional Recommendations: Caregiving  Support/Resources and Education on Hospice  Prognosis: < 6 months  Discharge Planning: Skilled Nursing Facility for rehab with Palliative care service follow-up   Chief Complaint/ Primary Diagnoses: Present on Admission:  . Sepsis (HCC) . Lactic acidosis . Hypertension . Dementia . UTI (lower urinary tract infection) . Protein-calorie malnutrition (HCC) . Alzheimer's disease . Toxic encephalopathy  I have reviewed the medical record, interviewed the patient and family, and examined the patient. The following aspects are pertinent.  Past Medical History  Diagnosis Date  . Alzheimer's disease   . Dementia   .  Diabetes mellitus without mention of complication   . Endothelial corneal dystrophy   . Legal blindness, as defined in [place]   . Unspecified glaucoma   . Dysphagia   . Depressive disorder, not elsewhere classified   . Hypertension   . Chronic kidney disease, unspecified (HCC)   . Renal disorder   . Generalized pain   . Osteoporosis    Social  History   Social History  . Marital Status: Single    Spouse Name: N/A  . Number of Children: N/A  . Years of Education: N/A   Social History Main Topics  . Smoking status: Former Smoker    Types: Cigarettes    Quit date: 05/29/1971  . Smokeless tobacco: Never Used  . Alcohol Use: No     Comment: not able to assess  . Drug Use: No  . Sexual Activity: Not Asked   Other Topics Concern  . None   Social History Narrative   No family history on file. Scheduled Meds: . antiseptic oral rinse  7 mL Mouth Rinse q12n4p  . chlorhexidine  15 mL Mouth Rinse BID  . enoxaparin (LOVENOX) injection  30 mg Subcutaneous Q24H  . hydroxypropyl methylcellulose / hypromellose  1 drop Both Eyes QID  . insulin aspart  0-15 Units Subcutaneous TID WC  . insulin aspart  0-5 Units Subcutaneous QHS  . piperacillin-tazobactam (ZOSYN)  IV  2.25 g Intravenous 3 times per day   Continuous Infusions: . 0.9 % NaCl with KCl 20 mEq / L 75 mL/hr at 04/09/15 1103   PRN Meds:.acetaminophen **OR** acetaminophen, ondansetron **OR** ondansetron (ZOFRAN) IV Medications Prior to Admission:  Prior to Admission medications   Medication Sig Start Date End Date Taking? Authorizing Provider  acetaminophen (TYLENOL) 325 MG tablet Take 650 mg by mouth 2 (two) times daily. For pain     Yes Historical Provider, MD  amLODipine (NORVASC) 5 MG tablet Take 5 mg by mouth daily.   Yes Historical Provider, MD  aspirin 81 MG chewable tablet Chew 81 mg by mouth daily. Take 1 tablet by mouth daily   Yes Historical Provider, MD  calcium-vitamin D (OSCAL WITH D) 500-200 MG-UNIT per tablet Take 1 tablet by mouth 3 (three) times daily.     Yes Historical Provider, MD  Cranberry 405 MG CAPS Take 405 mg by mouth 2 (two) times daily.     Yes Historical Provider, MD  dextrose (INSTA-GLUCOSE) 40 % GEL Take 1 Tube by mouth. If fingerstick blood sugar is less than 60 and resident can swallow give glucose gel and recheck  In 30 minutes and  continue hypoglycemia.   Yes Historical Provider, MD  escitalopram (LEXAPRO) 20 MG tablet Take 20 mg by mouth daily.   Yes Historical Provider, MD  galantamine (RAZADYNE ER) 16 MG 24 hr capsule Take 16 mg by mouth daily with breakfast.   Yes Historical Provider, MD  hydroxypropyl methylcellulose / hypromellose (ISOPTO TEARS / GONIOVISC) 2.5 % ophthalmic solution Place 1 drop into both eyes 4 (four) times daily.   Yes Historical Provider, MD  insulin aspart (NOVOLOG) 100 UNIT/ML injection Inject 5 Units into the skin daily as needed for high blood sugar. For CBG>250   Yes Historical Provider, MD  insulin glargine (LANTUS) 100 UNIT/ML injection Inject 18 Units into the skin at bedtime. * do not mix with other insulins *   Yes Historical Provider, MD  memantine (NAMENDA XR) 28 MG CP24 24 hr capsule Take 28 mg by mouth daily.  Yes Historical Provider, MD  Multiple Vitamins-Minerals (DECUBI-VITE PO) Take by mouth. 1 by mouth daily for wound healing   Yes Historical Provider, MD  Nutritional Supplements (NUTRITIONAL SHAKE PO) Take by mouth. Mighty shake three times daily with med pass 10a, 2p, 8p   Yes Historical Provider, MD  prednisoLONE acetate (PRED FORTE) 1 % ophthalmic suspension Place 1 drop into the left eye daily after breakfast.    Yes Historical Provider, MD  Protein (PROCEL) POWD Take by mouth. Give 1 scoop twice daily for nutritional support and to increase protein   Yes Historical Provider, MD  sennosides-docusate sodium (SENOKOT-S) 8.6-50 MG tablet Take 2 tablets by mouth at bedtime.    Yes Historical Provider, MD  albuterol (PROVENTIL) (5 MG/ML) 0.5% nebulizer solution Take 0.5 mLs (2.5 mg total) by nebulization every 4 (four) hours as needed for wheezing or shortness of breath. 06/01/11 05/31/12  Cristal Ford, MD  ipratropium (ATROVENT) 0.02 % nebulizer solution Take 2.5 mLs (0.5 mg total) by nebulization every 4 (four) hours as needed. 06/01/11 05/31/12  Cristal Ford, MD   Allergies    Allergen Reactions  . Ppd [Tuberculin Purified Protein Derivative]   . Tape     Review of Systems  Unable to perform ROS   Physical Exam  Constitutional: She appears well-developed. She appears lethargic.  HENT:  Head: Normocephalic.  Cardiovascular: Normal rate.   Respiratory: Effort normal. No accessory muscle usage. No tachypnea. No respiratory distress.  GI: Soft. Normal appearance. She exhibits no distension.  Neurological: She appears lethargic.  Minimally responsive.     Vital Signs: BP 148/69 mmHg  Pulse 96  Temp(Src) 99.8 F (37.7 C) (Axillary)  Resp 20  Ht  (1.575 m)  Wt 56.7 kg (125 lb)  BMI 22.86 kg/m2  SpO2 94%  SpO2: SpO2: 94 % O2 Device:SpO2: 94 % O2 Flow Rate: .O2 Flow Rate (L/min): 2 L/min  IO: Intake/output summary:  Intake/Output Summary (Last 24 hours) at 04/09/15 1600 Last data filed at 04/09/15 1200  Gross per 24 hour  Intake 4092.5 ml  Output      1 ml  Net 4091.5 ml   Baseline Weight: Weight: 56.7 kg (125 lb) Most recent weight: Weight: 56.7 kg (125 lb)      Palliative Assessment/Data:    Additional Data Reviewed:  CBC:    Component Value Date/Time   WBC 22.9* 04/09/2015 0750   WBC 4.9 09/21/2014 1440   HGB 10.3* 04/09/2015 0750   HCT 30.9* 04/09/2015 0750   PLT 169 04/09/2015 0750   MCV 90.1 04/09/2015 0750   NEUTROABS 19.0* 04/08/2015 1044   LYMPHSABS 0.6* 04/08/2015 1044   MONOABS 0.9 04/08/2015 1044   EOSABS 0.0 04/08/2015 1044   BASOSABS 0.0 04/08/2015 1044   Comprehensive Metabolic Panel:    Component Value Date/Time   NA 145 04/09/2015 1040   NA 141 09/21/2014 1440   K 3.9 04/09/2015 1040   CL 119* 04/09/2015 1040   CO2 21* 04/09/2015 1040   BUN 19 04/09/2015 1040   BUN 14 09/21/2014 1440   CREATININE 1.36* 04/09/2015 1040   CREATININE 0.6 09/21/2014 1440   GLUCOSE 149* 04/09/2015 1040   CALCIUM 7.7* 04/09/2015 1040   AST 67* 04/09/2015 1040   ALT 60* 04/09/2015 1040   ALKPHOS 74 04/09/2015 1040    BILITOT 0.7 04/09/2015 1040   PROT 5.6* 04/09/2015 1040   ALBUMIN 2.1* 04/09/2015 1040     Time In: 1515 Time Out: 1615 Time Total:  Greater than 50%  of this time was spent counseling and coordinating care related to the above assessment and plan.  Signed by: Ulice Bold, NP  Ulice Bold, NP  04/09/2015, 4:00 PM  Please contact Palliative Medicine Team phone at [phone] for questions and concerns.

## 2015-04-09 NOTE — Clinical Documentation Improvement (Signed)
Internal Medicine  Can the diagnosis of CKD be further specified?   CKD Stage I - GFR greater than or equal to 90  CKD Stage II - GFR 60-89  CKD Stage III - GFR 30-59  CKD Stage IV - GFR 15-29  Other condition  Unable to clinically determine   Supporting Information: AKI with history of CKD currently documented. Current renal labs below.   Component      BUN Creatinine  Latest Ref Rng      6 - 20 mg/dL 0.44 - 1.00 mg/dL  04/08/2015     10:44 AM 16 1.62 (H)   Component      EGFR (African American)  Latest Ref Rng      >60 mL/min  04/08/2015     10:44 AM 31 (L)    Please exercise your independent, professional judgment when responding. A specific answer is not anticipated or expected.  Thank you, Mateo Flow, RN [phone] Clinical Documentation Specialist

## 2015-04-10 LAB — BASIC METABOLIC PANEL
ANION GAP: 10 (ref 5–15)
BUN: 18 mg/dL (ref 6–20)
CO2: 15 mmol/L — ABNORMAL LOW (ref 22–32)
CREATININE: 1.33 mg/dL — AB (ref 0.44–1.00)
Calcium: 7.6 mg/dL — ABNORMAL LOW (ref 8.9–10.3)
Chloride: 120 mmol/L — ABNORMAL HIGH (ref 101–111)
GFR calc Af Amer: 40 mL/min — ABNORMAL LOW (ref >60)
GFR, EST NON AFRICAN AMERICAN: 34 mL/min — AB (ref >60)
GLUCOSE: 105 mg/dL — AB (ref 65–99)
POTASSIUM: 4.4 mmol/L (ref 3.5–5.1)
Sodium: 145 mmol/L (ref 135–145)

## 2015-04-10 LAB — CBC
HEMATOCRIT: 33.7 % — AB (ref 36.0–46.0)
Hemoglobin: 11.2 g/dL — ABNORMAL LOW (ref 12.0–15.0)
MCH: 30.3 pg (ref 26.0–34.0)
MCHC: 33.2 g/dL (ref 30.0–36.0)
MCV: 91.1 fL (ref 78.0–100.0)
PLATELETS: 162 10*3/uL (ref 150–400)
RBC: 3.7 MIL/uL — AB (ref 3.87–5.11)
RDW: 14 % (ref 11.5–15.5)
WBC: 18.3 10*3/uL — AB (ref 4.0–10.5)

## 2015-04-10 LAB — GLUCOSE, CAPILLARY
GLUCOSE-CAPILLARY: 122 mg/dL — AB (ref 65–99)
Glucose-Capillary: 106 mg/dL — ABNORMAL HIGH (ref 65–99)
Glucose-Capillary: 118 mg/dL — ABNORMAL HIGH (ref 65–99)
Glucose-Capillary: 111 mg/dL — ABNORMAL HIGH (ref 65–99)

## 2015-04-10 NOTE — Progress Notes (Signed)
Speech Language Pathology Treatment: Dysphagia  Patient Details Name: Adrienne Bailey MRN: [id number] DOB: May 21, 1924 Today's Date: 04/10/2015 Time: [id number] SLP Time Calculation (min) (ACUTE ONLY): 15 min  Assessment / Plan / Recommendation Clinical Impression  Diagnostic treatment complete to assess readiness for a initiation of a po diet. Performance with clinician provided po trials similar to that during initial evaluation 11/18 in which patient with present oral manipulation of bolus but with absent pharyngeal swallow despite total verbal and contextual cues provided. Oral cavity suctioned by SLP for safety. Patient not ready for a po diet at this time. Will continue to f/u. Suspect that with improved mentation/return to baseline, patient will be able to resume a po diet.     HPI HPI: Patient is an 81 y.o. female with a Past Medical History ofdementia, DM, CKD, HTN who presents with AMS and found to have sepsis in the setting of a UTI. Workup in the ED is pertinent for acute on chronic renal failure, elevated lactic acid, elevated WBC and a positive UA.      SLP Plan  Continue with current plan of care     Recommendations  Diet recommendations: NPO Medication Administration: Via alternative means              Oral Care Recommendations: Oral care QID Plan: Continue with current plan of care  [facility] Derelle Cockrell MA, CCC-SLP [phone]  Kendarrius Tanzi Meryl 04/10/2015, 8:48 AM

## 2015-04-10 NOTE — Progress Notes (Signed)
Son called back and is aware of pts transfer.

## 2015-04-10 NOTE — Progress Notes (Signed)
Attempted to call pts son to inform him of transfer. No answer.

## 2015-04-10 NOTE — Progress Notes (Signed)
Report called to Cindy RN on 5W.  

## 2015-04-10 NOTE — Progress Notes (Addendum)
PROGRESS NOTE  Adrienne Bailey QMV:[id number] DOB: 1923/11/27 DOA: 04/08/2015 PCP: Oneal Grout, MD  Assessment/Plan: Sepsis with gram negative rod bacteremia. Secondary to urinary tract infection -Continue IV fluids -Blood cultures pending 1/2 + for gram neg rods, urine culture pending  Toxic encephalopathy.  -Baseline is basically noncommunicative but alert and able to sit in chair -not able to swallow safely yet-- will leave NPO  Urinary tract infection. -Away cultures- ordered 11/18 -Recurrent -Zosyn as noted above -Monitor urine output as able  Diabetes -Home medications include 18 units of Lantus and 5 units of NovoLog as needed- hold while NPO  -Use sliding scale insulin  -Currently nothing by mouth  Alzheimer/dementia. Chart review indicates patient's baseline is able to sit up in a wheelchair but she is total care -discussed with son palliative care-- cade status changed to DNR  Hypertension.  -resume norvasc  Hypokalemia -replete through IVF  Elevated liver enzymes -trend  Code Status: full Family Communication: called son- agreeable with palliative care/hospice to follow at SNF Disposition Plan: stable to transfer to floor   Consultants:  Palliative care  Procedures:    HPI/Subjective: No SOB, no CP  Objective: Filed Vitals:   04/10/15 0747  BP:   Pulse:   Temp: 98.7 F (37.1 C)  Resp:     Intake/Output Summary (Last 24 hours) at 04/10/15 0906 Last data filed at 04/10/15 2841  Gross per 24 hour  Intake   1800 ml  Output      0 ml  Net   1800 ml   Filed Weights   04/08/15 1752  Weight: 56.7 kg (125 lb)    Exam:   General:  In bed, not speaking- withdraws to pain  Cardiovascular: rrr  Respiratory: clear  Abdomen: +BS, soft  Musculoskeletal: no edema  Data Reviewed: Basic Metabolic Panel:  Recent Labs Lab 04/08/15 1044 04/09/15 1040 04/10/15 0410  NA 141 145 145  K 3.2* 3.9 4.4  CL 108 119* 120*  CO2 20* 21*  15*  GLUCOSE 238* 149* 105*  BUN CREATININE 1.62* 1.36* 1.33*  CALCIUM 8.9 7.7* 7.6*   Liver Function Tests:  Recent Labs Lab 04/08/15 1044 04/09/15 1040  AST 184* 67*  ALT 94* 60*  ALKPHOS 83 74  BILITOT 0.3 0.7  PROT 6.3* 5.6*  ALBUMIN 2.6* 2.1*   No results for input(s): LIPASE, AMYLASE in the last 168 hours. No results for input(s): AMMONIA in the last 168 hours. CBC:  Recent Labs Lab 04/08/15 1044 04/09/15 0750 04/10/15 0410  WBC 20.5* 22.9* 18.3*  NEUTROABS 19.0*  --   --   HGB 12.4 10.3* 11.2*  HCT 37.2 30.9* 33.7*  MCV 91.9 90.1 91.1  PLT 197 169 162   Cardiac Enzymes: No results for input(s): CKTOTAL, CKMB, CKMBINDEX, TROPONINI in the last 168 hours. BNP (last 3 results) No results for input(s): BNP in the last 8760 hours.  ProBNP (last 3 results) No results for input(s): PROBNP in the last 8760 hours.  CBG:  Recent Labs Lab 04/09/15 0733 04/09/15 1107 04/09/15 1604 04/09/15 2132 04/10/15 0746  GLUCAP 190* 122* 111* 129* 106*    Recent Results (from the past 240 hour(s))  Culture, blood (routine x 2)     Status: None (Preliminary result)   Collection Time: 04/08/15 11:11 AM  Result Value Ref Range Status   Specimen Description BLOOD RIGHT FOREARM  Final   Special Requests BOTTLES DRAWN AEROBIC AND ANAEROBIC  Final   Culture  Setup Time   Final    GRAM NEGATIVE RODS AEROBIC BOTTLE ONLY CRITICAL RESULT CALLED TO, READ BACK BY AND VERIFIED WITH: R BROWN @0659  MKELLY 04/09/15    Culture GRAM NEGATIVE RODS  Final   Report Status PENDING  Incomplete  Culture, blood (routine x 2)     Status: None (Preliminary result)   Collection Time: 04/08/15 11:35 AM  Result Value Ref Range Status   Specimen Description BLOOD LEFT HAND  Final   Special Requests BOTTLES DRAWN AEROBIC ONLY 8ML  Final   Culture NO GROWTH < 24 HOURS  Final   Report Status PENDING  Incomplete  MRSA PCR Screening     Status: None   Collection Time: 04/08/15   5:40 PM  Result Value Ref Range Status   MRSA by PCR NEGATIVE NEGATIVE Final    Comment:        The GeneXpert MRSA Assay (FDA approved for NASAL specimens only), is one component of a comprehensive MRSA colonization surveillance program. It is not intended to diagnose MRSA infection nor to guide or monitor treatment for MRSA infections.   Culture, blood (x 2)     Status: None (Preliminary result)   Collection Time: 04/08/15  7:00 PM  Result Value Ref Range Status   Specimen Description BLOOD LEFT HAND  Final   Special Requests IN PEDIATRIC BOTTLE 1CC  Final   Culture  Setup Time   Final    GRAM NEGATIVE COCCOBACILLI AEROBIC BOTTLE ONLY CRITICAL RESULT CALLED TO, READ BACK BY AND VERIFIED WITH: C SCHILLER 04/09/15 @ 1244 M VESTAL    Culture GRAM NEGATIVE RODS  Final   Report Status PENDING  Incomplete  Culture, blood (x 2)     Status: None (Preliminary result)   Collection Time: 04/08/15  7:10 PM  Result Value Ref Range Status   Specimen Description BLOOD LEFT HAND  Final   Special Requests BOTTLES DRAWN AEROBIC AND ANAEROBIC 5CC   Final   Culture NO GROWTH < 24 HOURS  Final   Report Status PENDING  Incomplete     Studies: Ct Head Wo Contrast  04/08/2015  CLINICAL DATA:  Altered mental status, hypertension EXAM: CT HEAD WITHOUT CONTRAST TECHNIQUE: Contiguous axial images were obtained from the base of the skull through the vertex without intravenous contrast. COMPARISON:  05/27/2011 FINDINGS: No skull fracture is noted. Paranasal sinuses and mastoid air cells are unremarkable. No intracranial hemorrhage, mass effect or midline shift. No acute cortical infarction. No mass lesion is noted on this unenhanced scan. Stable atrophy and chronic white matter disease. Ventricular size is stable from prior exam. IMPRESSION: No acute intracranial abnormality. Stable atrophy and chronic white matter disease. Electronically Signed   By: Natasha MeadLiviu  Pop M.D.   On: 04/08/2015 12:04   Dg Chest  Portable 1 View  04/08/2015  CLINICAL DATA:  Fever, sepsis, altered mental status EXAM: PORTABLE CHEST 1 VIEW COMPARISON:  05/27/2011 FINDINGS: Stable cardiomegaly and central vascular congestion. Similar low lung volumes with left basilar streaky opacities compatible with scarring. No significant interval change or new finding. No superimposed CHF or edema. Negative for pneumothorax. Trachea is midline. Atherosclerosis of the aorta. Degenerative changes noted of the spine. Prior cholecystectomy evident. IMPRESSION: Cardiomegaly without CHF Low volume exam with similar left basilar chronic atelectasis/ scarring. Electronically Signed   By: Judie PetitM.  Shick M.D.   On: 04/08/2015 11:26    Scheduled Meds: . antiseptic oral rinse  7 mL Mouth Rinse q12n4p  . chlorhexidine  15 mL  Mouth Rinse BID  . enoxaparin (LOVENOX) injection  30 mg Subcutaneous Q24H  . hydroxypropyl methylcellulose / hypromellose  1 drop Both Eyes QID  . insulin aspart  0-15 Units Subcutaneous TID WC  . insulin aspart  0-5 Units Subcutaneous QHS  . piperacillin-tazobactam (ZOSYN)  IV  2.25 g Intravenous 3 times per day   Continuous Infusions: . 0.9 % NaCl with KCl 20 mEq / L 75 mL/hr at 04/09/15 1103   Antibiotics Given (last 72 hours)    Date/Time Action Medication Dose Rate   04/08/15 1949 Given   piperacillin-tazobactam (ZOSYN) IVPB 2.25 g 2.25 g 100 mL/hr   04/09/15 0514 Given   piperacillin-tazobactam (ZOSYN) IVPB 2.25 g 2.25 g 100 mL/hr   04/09/15 1300 Given   piperacillin-tazobactam (ZOSYN) IVPB 2.25 g 2.25 g 100 mL/hr   04/09/15 2149 Given   piperacillin-tazobactam (ZOSYN) IVPB 2.25 g 2.25 g 100 mL/hr   04/10/15 1914 Given   piperacillin-tazobactam (ZOSYN) IVPB 2.25 g 2.25 g 100 mL/hr      Principal Problem:   Sepsis (HCC) Active Problems:   Hypertension   Dementia   UTI (lower urinary tract infection)   Dysphagia   Toxic encephalopathy   Alzheimer's disease   Diabetes (HCC)   Protein-calorie malnutrition  (HCC)   Lactic acidosis   Pressure ulcer   Bacteremia    Time spent: 35 min    Adrienne Bailey [facility]  [facility] Pager [phone] If 7PM-7AM, please contact night-coverage at [email or web address], password [facility] 04/10/2015, 9:06 AM  LOS: 2 days

## 2015-04-11 DIAGNOSIS — R1310 Dysphagia, unspecified: Secondary | ICD-10-CM

## 2015-04-11 LAB — BASIC METABOLIC PANEL
Anion gap: 8 (ref 5–15)
BUN: 18 mg/dL (ref 6–20)
CALCIUM: 7.8 mg/dL — AB (ref 8.9–10.3)
CO2: 15 mmol/L — ABNORMAL LOW (ref 22–32)
CREATININE: 1.26 mg/dL — AB (ref 0.44–1.00)
Chloride: 123 mmol/L — ABNORMAL HIGH (ref 101–111)
GFR calc Af Amer: 42 mL/min — ABNORMAL LOW (ref >60)
GFR, EST NON AFRICAN AMERICAN: 37 mL/min — AB (ref >60)
GLUCOSE: 140 mg/dL — AB (ref 65–99)
Potassium: 4.5 mmol/L (ref 3.5–5.1)
SODIUM: 146 mmol/L — AB (ref 135–145)

## 2015-04-11 LAB — CBC
HCT: 35.2 % — ABNORMAL LOW (ref 36.0–46.0)
Hemoglobin: 12 g/dL (ref 12.0–15.0)
MCH: 30.9 pg (ref 26.0–34.0)
MCHC: 34.1 g/dL (ref 30.0–36.0)
MCV: 90.7 fL (ref 78.0–100.0)
PLATELETS: 203 10*3/uL (ref 150–400)
RBC: 3.88 MIL/uL (ref 3.87–5.11)
RDW: 14.3 % (ref 11.5–15.5)
WBC: 20.2 10*3/uL — ABNORMAL HIGH (ref 4.0–10.5)

## 2015-04-11 LAB — URINE CULTURE

## 2015-04-11 LAB — GLUCOSE, CAPILLARY
GLUCOSE-CAPILLARY: 153 mg/dL — AB (ref 65–99)
Glucose-Capillary: 103 mg/dL — ABNORMAL HIGH (ref 65–99)
Glucose-Capillary: 124 mg/dL — ABNORMAL HIGH (ref 65–99)

## 2015-04-11 LAB — LACTIC ACID, PLASMA: Lactic Acid, Venous: 1.6 mmol/L (ref 0.5–2.0)

## 2015-04-11 MED ORDER — ALBUTEROL SULFATE (2.5 MG/3ML) 0.083% IN NEBU: 2.5000 mg | INHALATION_SOLUTION | RESPIRATORY_TRACT | Status: DC | PRN

## 2015-04-11 MED ORDER — SODIUM CHLORIDE 0.45 % IV SOLN
INTRAVENOUS | Status: DC
  Administered 2015-04-11: 11:00:00 via INTRAVENOUS
  Administered 2015-04-12: 1000 mL via INTRAVENOUS

## 2015-04-11 MED ORDER — DEXTROSE 5 % IV SOLN
2.0000 g | INTRAVENOUS | Status: DC
  Administered 2015-04-11 – 2015-04-12 (×2): 2 g via INTRAVENOUS
  Filled 2015-04-11 (×3): qty 2

## 2015-04-11 MED ORDER — AMLODIPINE BESYLATE 5 MG PO TABS
5.0000 mg | ORAL_TABLET | Freq: Every day | ORAL | Status: DC
  Administered 2015-04-11 – 2015-04-13 (×3): 5 mg via ORAL
  Filled 2015-04-11 (×3): qty 1

## 2015-04-11 NOTE — Care Management Important Message (Signed)
Important Message  Patient Details  Name: Adrienne Bailey MRN: [id number] Date of Birth: [birth year]   Medicare Important Message Given:  Yes    Kyla BalzarineShealy, Eboni Coval Abena 04/11/2015, 8:42 AM

## 2015-04-11 NOTE — Progress Notes (Signed)
Speech Language Pathology Treatment: Dysphagia  Patient Details Name: Adrienne Bailey MRN: [id number] DOB: [birth year] Today's Date: 04/11/2015 Time: [id number] SLP Time Calculation (min) (ACUTE ONLY): 19 min  Assessment / Plan / Recommendation Clinical Impression  Pt seen for ability to initiate food/liquid with son at bedside. She was able to initiate pharyngeal swallow unlike yesterday. Exhibited dementia-like oral impairments with labial residue and prolonged manipulation. Suspect delayed swallow initiation. Educated son re: affects of dementia on swallow function, progression and inability to determine present aspiration with pt (not appropriate for MBS) and possible repercussions. He stated his desire for mom to eat and drink with risks. Positioning is an obstacle as she scoots low in bed after repositioning. Initiate Dys 1, honey thick, crush meds and full supervision. ST will sign off.   HPI HPI: Patient is an 81 y.o. female with a Past Medical History ofdementia, DM, CKD, HTN who presents with AMS and found to have sepsis in the setting of a UTI. Workup in the ED is pertinent for acute on chronic renal failure, elevated lactic acid, elevated WBC and a positive UA.      SLP Plan  Continue with current plan of care     Recommendations  Diet recommendations: Dysphagia 1 (puree);Honey-thick liquid Liquids provided via: Cup;No straw Medication Administration: Crushed with puree Supervision: Full supervision/cueing for compensatory strategies;Staff to assist with self feeding Compensations: Slow rate;Small sips/bites Postural Changes and/or Swallow Maneuvers: Seated upright 90 degrees              Oral Care Recommendations: Oral care BID Follow up Recommendations: None Plan: Continue with current plan of care   Royce MacadamiaLitaker, Sanjuan Sawa Willis 04/11/2015, 12:14 PM  Breck CoonsLisa Willis Lonell FaceLitaker M.Ed [employer]-SLP Pager [phone]

## 2015-04-11 NOTE — Progress Notes (Signed)
PROGRESS NOTE  Adrienne Bailey XBM:[id number] DOB: 1924/03/17 DOA: 04/08/2015 PCP: Oneal GroutPANDEY, MAHIMA, MD  Assessment/Plan: Sepsis with gram negative rod bacteremia. Secondary to urinary tract infection -Continue IV fluids -Blood cultures pending 1/2 + for gram neg rods, urine culture pending  Toxic encephalopathy.  -Baseline is basically noncommunicative but alert and able to sit in chair -not able to swallow safely yet-- will leave NPO  Hypernatremia -change IVF -ideally would like to give free water once able to swallow  Lactic acidosis -recheck to ensure resolution  Urinary tract infection. - cultures- ordered 11/18 -Recurrent -change to rocephin  AKI on CKD stage III -improved with IVF  Diabetes -Home medications include 18 units of Lantus and 5 units of NovoLog as needed- hold while NPO  -Use sliding scale insulin  -Currently nothing by mouth  Alzheimer/dementia. Chart review indicates patient's baseline is able to sit up in a wheelchair but she is total care -discussed with son palliative care-- code status changed to DNR  Hypertension.  -resume norvasc  Hypokalemia -replete through IVF  Elevated liver enzymes -trend  Code Status: full Family Communication: called son- agreeable with palliative care/hospice to follow at SNF Disposition Plan: SNF once able to eat   Consultants:  Palliative care  Procedures:    HPI/Subjective: Nursing reports wheezing this AM  Objective: Filed Vitals:   04/11/15 0557  BP: 116/65  Pulse: 80  Temp: 98.7 F (37.1 C)  Resp: 19    Intake/Output Summary (Last 24 hours) at 04/11/15 1052 Last data filed at 04/11/15 0836  Gross per 24 hour  Intake   1375 ml  Output      0 ml  Net   1375 ml   Filed Weights   04/08/15 1752  Weight: 56.7 kg (125 lb)    Exam:   General:  More interactive today- nodding head but not able to comprehend   Cardiovascular: rrr  Respiratory: clear  Abdomen: +BS,  soft  Musculoskeletal: no edema  Data Reviewed: Basic Metabolic Panel:  Recent Labs Lab 04/08/15 1044 04/09/15 1040 04/10/15 0410 04/11/15 0021  NA 141 145 145 146*  K 3.2* 3.9 4.4 4.5  CL 108 119* 120* 123*  CO2 20* 21* 15* 15*  GLUCOSE 238* 149* 105* 140*  BUN 16 19 18 18   CREATININE 1.62* 1.36* 1.33* 1.26*  CALCIUM 8.9 7.7* 7.6* 7.8*   Liver Function Tests:  Recent Labs Lab 04/08/15 1044 04/09/15 1040  AST 184* 67*  ALT 94* 60*  ALKPHOS 83 74  BILITOT 0.3 0.7  PROT 6.3* 5.6*  ALBUMIN 2.6* 2.1*   No results for input(s): LIPASE, AMYLASE in the last 168 hours. No results for input(s): AMMONIA in the last 168 hours. CBC:  Recent Labs Lab 04/08/15 1044 04/09/15 0750 04/10/15 0410 04/11/15 0021  WBC 20.5* 22.9* 18.3* 20.2*  NEUTROABS 19.0*  --   --   --   HGB 12.4 10.3* 11.2* 12.0  HCT 37.2 30.9* 33.7* 35.2*  MCV 91.9 90.1 91.1 90.7  PLT 197 169 162 203   Cardiac Enzymes: No results for input(s): CKTOTAL, CKMB, CKMBINDEX, TROPONINI in the last 168 hours. BNP (last 3 results) No results for input(s): BNP in the last 8760 hours.  ProBNP (last 3 results) No results for input(s): PROBNP in the last 8760 hours.  CBG:  Recent Labs Lab 04/10/15 0746 04/10/15 1224 04/10/15 1547 04/10/15 2140 04/11/15 0801  GLUCAP 106* 118* 111* 122* 153*    Recent Results (from the past 240 hour(s))  Culture,  blood (routine x 2)     Status: None (Preliminary result)   Collection Time: 04/08/15 11:11 AM  Result Value Ref Range Status   Specimen Description BLOOD RIGHT FOREARM  Final   Special Requests BOTTLES DRAWN AEROBIC AND ANAEROBIC  Final   Culture  Setup Time   Final    GRAM NEGATIVE RODS AEROBIC BOTTLE ONLY CRITICAL RESULT CALLED TO, READ BACK BY AND VERIFIED WITH: R BROWN  MKELLY 04/09/15    Culture KLEBSIELLA PNEUMONIAE  Final   Report Status PENDING  Incomplete   Organism ID, Bacteria KLEBSIELLA PNEUMONIAE  Final      Susceptibility    Klebsiella pneumoniae - MIC*    AMPICILLIN <=2 RESISTANT Resistant     CEFAZOLIN <=4 SENSITIVE Sensitive     CEFEPIME <=1 SENSITIVE Sensitive     CEFTAZIDIME <=1 SENSITIVE Sensitive     CEFTRIAXONE <=1 SENSITIVE Sensitive     CIPROFLOXACIN <=0.25 SENSITIVE Sensitive     GENTAMICIN <=1 SENSITIVE Sensitive     IMIPENEM 0.5 SENSITIVE Sensitive     TRIMETH/SULFA <=20 SENSITIVE Sensitive     AMPICILLIN/SULBACTAM <=2 SENSITIVE Sensitive     PIP/TAZO <=4 SENSITIVE Sensitive     * KLEBSIELLA PNEUMONIAE  Culture, blood (routine x 2)     Status: None (Preliminary result)   Collection Time: 04/08/15 11:35 AM  Result Value Ref Range Status   Specimen Description BLOOD LEFT HAND  Final   Special Requests BOTTLES DRAWN AEROBIC ONLY  Final   Culture NO GROWTH 2 DAYS  Final   Report Status PENDING  Incomplete  MRSA PCR Screening     Status: None   Collection Time: 04/08/15  5:40 PM  Result Value Ref Range Status   MRSA by PCR NEGATIVE NEGATIVE Final    Comment:        The GeneXpert MRSA Assay (FDA approved for NASAL specimens only), is one component of a comprehensive MRSA colonization surveillance program. It is not intended to diagnose MRSA infection nor to guide or monitor treatment for MRSA infections.   Culture, blood (x 2)     Status: None (Preliminary result)   Collection Time: 04/08/15  7:00 PM  Result Value Ref Range Status   Specimen Description BLOOD LEFT HAND  Final   Special Requests IN PEDIATRIC BOTTLE 1CC  Final   Culture  Setup Time   Final    GRAM NEGATIVE COCCOBACILLI AEROBIC BOTTLE ONLY CRITICAL RESULT CALLED TO, READ BACK BY AND VERIFIED WITH: C SCHILLER 04/09/15 @ 1244 M VESTAL    Culture   Final    KLEBSIELLA PNEUMONIAE SUSCEPTIBILITIES PERFORMED ON PREVIOUS CULTURE WITHIN THE LAST 5 DAYS.    Report Status PENDING  Incomplete  Culture, blood (x 2)     Status: None (Preliminary result)   Collection Time: 04/08/15  7:10 PM  Result Value Ref Range Status    Specimen Description BLOOD LEFT HAND  Final   Special Requests BOTTLES DRAWN AEROBIC AND ANAEROBIC 5CC   Final   Culture NO GROWTH 2 DAYS  Final   Report Status PENDING  Incomplete  Culture, Urine     Status: None (Preliminary result)   Collection Time: 04/09/15  8:15 AM  Result Value Ref Range Status   Specimen Description URINE, CATHETERIZED  Final   Special Requests NONE  Final   Culture CULTURE REINCUBATED FOR BETTER GROWTH  Final   Report Status PENDING  Incomplete     Studies: No results found.  Scheduled Meds: .  antiseptic oral rinse  7 mL Mouth Rinse q12n4p  . chlorhexidine  15 mL Mouth Rinse BID  . enoxaparin (LOVENOX) injection  30 mg Subcutaneous Q24H  . hydroxypropyl methylcellulose / hypromellose  1 drop Both Eyes QID  . insulin aspart  0-15 Units Subcutaneous TID WC  . insulin aspart  0-5 Units Subcutaneous QHS  . piperacillin-tazobactam (ZOSYN)  IV  2.25 g Intravenous 3 times per day   Continuous Infusions: . sodium chloride 50 mL/hr at 04/11/15 1044   Antibiotics Given (last 72 hours)    Date/Time Action Medication Dose Rate   04/08/15 1949 Given   piperacillin-tazobactam (ZOSYN) IVPB 2.25 g 2.25 g 100 mL/hr   04/09/15 0514 Given   piperacillin-tazobactam (ZOSYN) IVPB 2.25 g 2.25 g 100 mL/hr   04/09/15 1300 Given   piperacillin-tazobactam (ZOSYN) IVPB 2.25 g 2.25 g 100 mL/hr   04/09/15 2149 Given   piperacillin-tazobactam (ZOSYN) IVPB 2.25 g 2.25 g 100 mL/hr   04/10/15 1610 Given   piperacillin-tazobactam (ZOSYN) IVPB 2.25 g 2.25 g 100 mL/hr   04/10/15 1614 Given   piperacillin-tazobactam (ZOSYN) IVPB 2.25 g 2.25 g 100 mL/hr   04/10/15 2150 Given   piperacillin-tazobactam (ZOSYN) IVPB 2.25 g 2.25 g 100 mL/hr   04/11/15 9604 Given   piperacillin-tazobactam (ZOSYN) IVPB 2.25 g 2.25 g 100 mL/hr      Principal Problem:   Sepsis (HCC) Active Problems:   Hypertension   Dementia   UTI (lower urinary tract infection)   Dysphagia   Toxic encephalopathy    Alzheimer's disease   Diabetes (HCC)   Protein-calorie malnutrition (HCC)   Lactic acidosis   Pressure ulcer   Bacteremia    Time spent: 25 min    JESSICA [facility]  [facility] Pager [phone] If 7PM-7AM, please contact night-coverage at [email or web address], password [facility] 04/11/2015, 10:52 AM  LOS: 3 days

## 2015-04-12 ENCOUNTER — Inpatient Hospital Stay (HOSPITAL_COMMUNITY): Payer: Medicare Other

## 2015-04-12 DIAGNOSIS — Z515 Encounter for palliative care: Secondary | ICD-10-CM | POA: Insufficient documentation

## 2015-04-12 LAB — BASIC METABOLIC PANEL
Anion gap: 10 (ref 5–15)
BUN: 13 mg/dL (ref 6–20)
CHLORIDE: 121 mmol/L — AB (ref 101–111)
CO2: 15 mmol/L — AB (ref 22–32)
CREATININE: 1.15 mg/dL — AB (ref 0.44–1.00)
Calcium: 8.1 mg/dL — ABNORMAL LOW (ref 8.9–10.3)
GFR calc Af Amer: 47 mL/min — ABNORMAL LOW (ref >60)
GFR calc non Af Amer: 41 mL/min — ABNORMAL LOW (ref >60)
Glucose, Bld: 137 mg/dL — ABNORMAL HIGH (ref 65–99)
Potassium: 3.8 mmol/L (ref 3.5–5.1)
Sodium: 146 mmol/L — ABNORMAL HIGH (ref 135–145)

## 2015-04-12 LAB — CULTURE, BLOOD (ROUTINE X 2)

## 2015-04-12 LAB — CBC
HEMATOCRIT: 33 % — AB (ref 36.0–46.0)
HEMOGLOBIN: 10.9 g/dL — AB (ref 12.0–15.0)
MCH: 29.4 pg (ref 26.0–34.0)
MCHC: 33 g/dL (ref 30.0–36.0)
MCV: 88.9 fL (ref 78.0–100.0)
Platelets: 130 10*3/uL — ABNORMAL LOW (ref 150–400)
RBC: 3.71 MIL/uL — ABNORMAL LOW (ref 3.87–5.11)
RDW: 14 % (ref 11.5–15.5)
WBC: 9.5 10*3/uL (ref 4.0–10.5)

## 2015-04-12 LAB — GLUCOSE, CAPILLARY
GLUCOSE-CAPILLARY: 110 mg/dL — AB (ref 65–99)
Glucose-Capillary: 107 mg/dL — ABNORMAL HIGH (ref 65–99)
Glucose-Capillary: 126 mg/dL — ABNORMAL HIGH (ref 65–99)

## 2015-04-12 LAB — LACTIC ACID, PLASMA: Lactic Acid, Venous: 1 mmol/L (ref 0.5–2.0)

## 2015-04-12 MED ORDER — FUROSEMIDE 10 MG/ML IJ SOLN
40.0000 mg | Freq: Once | INTRAMUSCULAR | Status: AC
  Administered 2015-04-12: 40 mg via INTRAVENOUS
  Filled 2015-04-12: qty 4

## 2015-04-12 MED ORDER — RESOURCE THICKENUP CLEAR PO POWD
ORAL | Status: DC | PRN
  Filled 2015-04-12: qty 125

## 2015-04-12 NOTE — Progress Notes (Signed)
PROGRESS NOTE  Adrienne Bailey ZOX:[id number] DOB: 1923-08-30 DOA: 04/08/2015 PCP: Oneal Grout, MD  Assessment/Plan: Sepsis with gram negative rod bacteremia. Secondary to urinary tract infection-- Klebsiella PNA -Blood cultures pending 1/2 + for gram neg rods x2 Urine culture done after abx -repeat blood cultures again  Toxic encephalopathy.  -Baseline is basically noncommunicative but alert and able to sit in chair-- appears to be back at baseline -DYS diet  Hypernatremia -change IVF -encourage free water  Lactic acidosis -resolved  Urinary tract infection. - cultures- ordered 11/18 -Recurrent -changed to rocephin  AKI on CKD stage III -improved with IVF  Diabetes -Home medications include 18 units of Lantus and 5 units of NovoLog as needed -Use sliding scale insulin here only  Alzheimer/dementia. Chart review indicates patient's baseline is able to sit up in a wheelchair but she is total care -discussed with son palliative care-- code status changed to DNR- he would like hopsice/pallative care to follow at SNF  Hypertension.  -resume norvasc  Hypokalemia -replete through IVF  Elevated liver enzymes -trend  Code Status: full Family Communication: called son- agreeable with palliative care/hospice to follow at SNF Disposition Plan: SNF in AM if REPEAT blood cultures negative   Consultants:  Palliative care  Procedures:    HPI/Subjective: Sleepy this AM  Objective: Filed Vitals:   04/12/15 0027 04/12/15 0639  BP: 162/111 149/81  Pulse: 64 100  Temp:  98.6 F (37 C)  Resp:  18    Intake/Output Summary (Last 24 hours) at 04/12/15 1019 Last data filed at 04/12/15 1012  Gross per 24 hour  Intake    985 ml  Output    500 ml  Net    485 ml   Filed Weights   04/08/15 1752  Weight: 56.7 kg (125 lb)    Exam:   General:  Appears sleepy, not speaking today as much as yesterday  Cardiovascular: rrr  Respiratory: clear  Abdomen: +BS,  soft  Musculoskeletal: no edema  Data Reviewed: Basic Metabolic Panel:  Recent Labs Lab 04/08/15 1044 04/09/15 1040 04/10/15 0410 04/11/15 0021 04/12/15 0523  NA 141 145 145 146* 146*  K 3.2* 3.9 4.4 4.5 3.8  CL 108 119* 120* 123* 121*  CO2 20* 21* 15* 15* 15*  GLUCOSE 238* 149* 105* 140* 137*  BUN CREATININE 1.62* 1.36* 1.33* 1.26* 1.15*  CALCIUM 8.9 7.7* 7.6* 7.8* 8.1*   Liver Function Tests:  Recent Labs Lab 04/08/15 1044 04/09/15 1040  AST 184* 67*  ALT 94* 60*  ALKPHOS 83 74  BILITOT 0.3 0.7  PROT 6.3* 5.6*  ALBUMIN 2.6* 2.1*   No results for input(s): LIPASE, AMYLASE in the last 168 hours. No results for input(s): AMMONIA in the last 168 hours. CBC:  Recent Labs Lab 04/08/15 1044 04/09/15 0750 04/10/15 0410 04/11/15 0021 04/12/15 0523  WBC 20.5* 22.9* 18.3* 20.2* 9.5  NEUTROABS 19.0*  --   --   --   --   HGB 12.4 10.3* 11.2* 12.0 10.9*  HCT 37.2 30.9* 33.7* 35.2* 33.0*  MCV 91.9 90.1 91.1 90.7 88.9  PLT 197 169 162 203 130*   Cardiac Enzymes: No results for input(s): CKTOTAL, CKMB, CKMBINDEX, TROPONINI in the last 168 hours. BNP (last 3 results) No results for input(s): BNP in the last 8760 hours.  ProBNP (last 3 results) No results for input(s): PROBNP in the last 8760 hours.  CBG:  Recent Labs Lab 04/10/15 2140 04/11/15 0801 04/11/15 1156 04/11/15 1710  04/12/15 0813  GLUCAP 122* 153* 103* 124* 107*    Recent Results (from the past 240 hour(s))  Culture, blood (routine x 2)     Status: None   Collection Time: 04/08/15 11:11 AM  Result Value Ref Range Status   Specimen Description BLOOD RIGHT FOREARM  Final   Special Requests BOTTLES DRAWN AEROBIC AND ANAEROBIC  Final   Culture  Setup Time   Final    GRAM NEGATIVE RODS AEROBIC BOTTLE ONLY CRITICAL RESULT CALLED TO, READ BACK BY AND VERIFIED WITH: R BROWN  MKELLY 04/09/15    Culture KLEBSIELLA PNEUMONIAE  Final   Report Status 04/12/2015 FINAL  Final     Organism ID, Bacteria KLEBSIELLA PNEUMONIAE  Final      Susceptibility   Klebsiella pneumoniae - MIC*    AMPICILLIN <=2 RESISTANT Resistant     CEFAZOLIN <=4 SENSITIVE Sensitive     CEFEPIME <=1 SENSITIVE Sensitive     CEFTAZIDIME <=1 SENSITIVE Sensitive     CEFTRIAXONE <=1 SENSITIVE Sensitive     CIPROFLOXACIN <=0.25 SENSITIVE Sensitive     GENTAMICIN <=1 SENSITIVE Sensitive     IMIPENEM 0.5 SENSITIVE Sensitive     TRIMETH/SULFA <=20 SENSITIVE Sensitive     AMPICILLIN/SULBACTAM <=2 SENSITIVE Sensitive     PIP/TAZO <=4 SENSITIVE Sensitive     * KLEBSIELLA PNEUMONIAE  Culture, blood (routine x 2)     Status: None (Preliminary result)   Collection Time: 04/08/15 11:35 AM  Result Value Ref Range Status   Specimen Description BLOOD LEFT HAND  Final   Special Requests BOTTLES DRAWN AEROBIC ONLY  Final   Culture NO GROWTH 3 DAYS  Final   Report Status PENDING  Incomplete  MRSA PCR Screening     Status: None   Collection Time: 04/08/15  5:40 PM  Result Value Ref Range Status   MRSA by PCR NEGATIVE NEGATIVE Final    Comment:        The GeneXpert MRSA Assay (FDA approved for NASAL specimens only), is one component of a comprehensive MRSA colonization surveillance program. It is not intended to diagnose MRSA infection nor to guide or monitor treatment for MRSA infections.   Culture, blood (x 2)     Status: None   Collection Time: 04/08/15  7:00 PM  Result Value Ref Range Status   Specimen Description BLOOD LEFT HAND  Final   Special Requests IN PEDIATRIC BOTTLE 1CC  Final   Culture  Setup Time   Final    GRAM NEGATIVE COCCOBACILLI AEROBIC BOTTLE ONLY CRITICAL RESULT CALLED TO, READ BACK BY AND VERIFIED WITH: C SCHILLER 04/09/15 @ 1244 M VESTAL    Culture   Final    KLEBSIELLA PNEUMONIAE SUSCEPTIBILITIES PERFORMED ON PREVIOUS CULTURE WITHIN THE LAST 5 DAYS.    Report Status 04/12/2015 FINAL  Final  Culture, blood (x 2)     Status: None (Preliminary result)    Collection Time: 04/08/15  7:10 PM  Result Value Ref Range Status   Specimen Description BLOOD LEFT HAND  Final   Special Requests BOTTLES DRAWN AEROBIC AND ANAEROBIC 5CC   Final   Culture NO GROWTH 3 DAYS  Final   Report Status PENDING  Incomplete  Culture, Urine     Status: None   Collection Time: 04/09/15  8:15 AM  Result Value Ref Range Status   Specimen Description URINE, CATHETERIZED  Final   Special Requests NONE  Final   Culture MULTIPLE SPECIES PRESENT, SUGGEST RECOLLECTION  Final  Report Status 04/11/2015 FINAL  Final     Studies: No results found.  Scheduled Meds: . amLODipine  5 mg Oral Daily  . antiseptic oral rinse  7 mL Mouth Rinse q12n4p  . cefTRIAXone (ROCEPHIN)  IV  2 g Intravenous Q24H  . chlorhexidine  15 mL Mouth Rinse BID  . enoxaparin (LOVENOX) injection  30 mg Subcutaneous Q24H  . hydroxypropyl methylcellulose / hypromellose  1 drop Both Eyes QID  . insulin aspart  0-15 Units Subcutaneous TID WC  . insulin aspart  0-5 Units Subcutaneous QHS   Continuous Infusions: . sodium chloride 1,000 mL (04/12/15 0802)   Antibiotics Given (last 72 hours)    Date/Time Action Medication Dose Rate   04/09/15 1300 Given   piperacillin-tazobactam (ZOSYN) IVPB 2.25 g 2.25 g 100 mL/hr   04/09/15 2149 Given   piperacillin-tazobactam (ZOSYN) IVPB 2.25 g 2.25 g 100 mL/hr   04/10/15 [id number] Given   piperacillin-tazobactam (ZOSYN) IVPB 2.25 g 2.25 g 100 mL/hr   04/10/15 1614 Given   piperacillin-tazobactam (ZOSYN) IVPB 2.25 g 2.25 g 100 mL/hr   04/10/15 2150 Given   piperacillin-tazobactam (ZOSYN) IVPB 2.25 g 2.25 g 100 mL/hr   04/11/15 [id number] Given   piperacillin-tazobactam (ZOSYN) IVPB 2.25 g 2.25 g 100 mL/hr   04/11/15 1226 Given   cefTRIAXone (ROCEPHIN) 2 g in dextrose 5 % 50 mL IVPB 2 g 100 mL/hr   04/12/15 1016 Given   cefTRIAXone (ROCEPHIN) 2 g in dextrose 5 % 50 mL IVPB 2 g 100 mL/hr      Principal Problem:   Sepsis (HCC) Active Problems:   Hypertension    Dementia   UTI (lower urinary tract infection)   Dysphagia   Toxic encephalopathy   Alzheimer's disease   Diabetes (HCC)   Protein-calorie malnutrition (HCC)   Lactic acidosis   Pressure ulcer   Bacteremia    Time spent: 25 min    Levon Boettcher [facility]  [facility] Pager [phone] If 7PM-7AM, please contact night-coverage at [email or web address], password [email or web address] 04/12/2015, 10:19 AM  LOS: 4 days

## 2015-04-12 NOTE — Progress Notes (Signed)
Nutrition Follow-up  DOCUMENTATION CODES:   Not applicable  INTERVENTION:   -Magic Cup TID with meals  NUTRITION DIAGNOSIS:   Inadequate oral intake related to dysphagia as evidenced by meal completion < 25%.  Ongoing  GOAL:   Patient will meet greater than or equal to 90% of their needs  Unmet  MONITOR:   PO intake, Supplement acceptance, Labs, Weight trends, Skin, I & O's  REASON FOR ASSESSMENT:   Malnutrition Screening Tool    ASSESSMENT:   81 y.o. female with a history of dementia, diabetes, chronic kidney disease, hypertension, legally blind [facility] department from the facility with the chief complaint of altered mental status and hypotension. Workup in the emergency department revealed acute sepsis likely related to UTI.  SLP evaluated. Pt remains at aspiration risk due to dementia like oral impairments. Per SLP note, pt son would like pt to eat and drink without risk. She has been advanced to a dysphagia 1 diet with honey thick liquids.   Pt was very lethargic at time of visit. Noted breakfast tray on bedside table was untouched.   Palliative care team following. Goal is focusing on pt's comfort and to treat the treatable. Plan to d/c to SNF with hospice once medically ready.   Labs reviewed: Na: 146, CBGS: 107-110.  Diet Order:  DIET - DYS 1 Room service appropriate?: Yes; Fluid consistency:: Honey Thick  Skin:  Wound (see comment) (stage I sacrum)  Last BM:  04/12/15  Height:   Ht Readings from Last 1 Encounters:  04/08/15 5\' 2"  (1.575 m)    Weight:   Wt Readings from Last 1 Encounters:  04/08/15 125 lb (56.7 kg)    Ideal Body Weight:  50 kg  BMI:  Body mass index is 22.86 kg/(m^2).  Estimated Nutritional Needs:   Kcal:  1400-1600  Protein:  70-85 gm  Fluid:  >/= 1.5 L  EDUCATION NEEDS:   No education needs identified at this time  Makayleigh Poliquin A. Mayford KnifeWilliams, RD, LDN,  CDE Pager: [phone] After hours Pager: [phone]

## 2015-04-12 NOTE — Progress Notes (Signed)
Daily Progress Note   Patient Name: Adrienne Bailey       Date: 04/12/2015 DOB: 23-Feb-1924  Age: 81 y.o. MRN#: [id number] Attending Physician: Geradine Girt, DO Primary Care Physician: Blanchie Serve, MD Admit Date: 04/08/2015  Reason for Consultation/Follow-up: Establishing goals of care  Subjective: Ms. Schmelzer is more alert today than when I met her Friday. She does respond to my greeting of good morning by "good morning." I was unable to get her to say anything else or answer any further questions. Doesn't appear to have eaten any breakfast this morning. Appears comfortable  I did speak with son, Saralyn Pilar. We discussed the risks of her eating and poor prognosis with dementia. He seems more understanding today. I told him that the 2 things I am most worried about are her aspirating and not having enough intake - he understands that these are 2 things we cannot fix medically and this is typical of end stage dementia. He had mentioned having hospice at SNF and we discussed hospice philosophy and goals and he agrees this will be beneficial to her at this stage. No further questions/concerns at this time.   Length of Stay: 4 days  Current Medications: Scheduled Meds:  . amLODipine  5 mg Oral Daily  . antiseptic oral rinse  7 mL Mouth Rinse q12n4p  . cefTRIAXone (ROCEPHIN)  IV  2 g Intravenous Q24H  . chlorhexidine  15 mL Mouth Rinse BID  . enoxaparin (LOVENOX) injection  30 mg Subcutaneous Q24H  . hydroxypropyl methylcellulose / hypromellose  1 drop Both Eyes QID  . insulin aspart  0-15 Units Subcutaneous TID WC  . insulin aspart  0-5 Units Subcutaneous QHS    Continuous Infusions: . sodium chloride 1,000 mL (04/12/15 0802)    PRN Meds: acetaminophen **OR** acetaminophen, albuterol,  ondansetron **OR** ondansetron (ZOFRAN) IV  Physical Exam: Physical Exam  Constitutional: She appears well-developed.  HENT:  Head: Normocephalic.  Cardiovascular: Normal rate.   Pulmonary/Chest: Effort normal and breath sounds normal. No accessory muscle usage. No tachypnea. No respiratory distress.  Abdominal: Soft. Normal appearance. She exhibits no distension.  Neurological: She is alert.  More alert today. She did respond to me "good morning." no further verbalization.                 Vital Signs:  BP 149/81 mmHg  Pulse 100  Temp(Src) 98.6 F (37 C) (Oral)  Resp 18  Ht 5' 2"  (1.575 m)  Wt 56.7 kg (125 lb)  BMI 22.86 kg/m2  SpO2 99% SpO2: SpO2: 99 % O2 Device: O2 Device: Not Delivered O2 Flow Rate: O2 Flow Rate (L/min): 2 L/min  Intake/output summary:  Intake/Output Summary (Last 24 hours) at 04/12/15 1203 Last data filed at 04/12/15 1012  Gross per 24 hour  Intake    985 ml  Output    500 ml  Net    485 ml   LBM: Last BM Date: 04/12/15 Baseline Weight: Weight: 56.7 kg (125 lb) Most recent weight: Weight: 56.7 kg (125 lb)       Palliative Assessment/Data:   Additional Data Reviewed: CBC    Component Value Date/Time   WBC 9.5 04/12/2015 0523   WBC 4.9 09/21/2014 1440   RBC 3.71* 04/12/2015 0523   HGB 10.9* 04/12/2015 0523   HCT 33.0* 04/12/2015 0523   PLT 130* 04/12/2015 0523   MCV 88.9 04/12/2015 0523   MCH 29.4 04/12/2015 0523   MCHC 33.0 04/12/2015 0523   RDW 14.0 04/12/2015 0523   LYMPHSABS 0.6* 04/08/2015 1044   MONOABS 0.9 04/08/2015 1044   EOSABS 0.0 04/08/2015 1044   BASOSABS 0.0 04/08/2015 1044    CMP     Component Value Date/Time   NA 146* 04/12/2015 0523   NA 141 09/21/2014 1440   K 3.8 04/12/2015 0523   CL 121* 04/12/2015 0523   CO2 15* 04/12/2015 0523   GLUCOSE 137* 04/12/2015 0523   BUN 13 04/12/2015 0523   BUN 14 09/21/2014 1440   CREATININE 1.15* 04/12/2015 0523   CREATININE 0.6 09/21/2014 1440   CALCIUM 8.1* 04/12/2015  0523   PROT 5.6* 04/09/2015 1040   ALBUMIN 2.1* 04/09/2015 1040   AST 67* 04/09/2015 1040   ALT 60* 04/09/2015 1040   ALKPHOS 74 04/09/2015 1040   BILITOT 0.7 04/09/2015 1040   GFRNONAA 41* 04/12/2015 0523   GFRAA 47* 04/12/2015 0523       Problem List:  Patient Active Problem List   Diagnosis Date Noted  . Palliative care encounter   . Pressure ulcer 04/09/2015  . Bacteremia 04/09/2015  . Sepsis ([place]) 04/08/2015  . Lactic acidosis 04/08/2015  . Chronic kidney disease, unspecified ([place])   . Legal blindness, as defined in [place]   . Recurrent UTI 03/29/2015  . Diabetes mellitus with peripheral vascular disease ([place]) 01/29/2015  . PAD (peripheral artery disease) ([place]) 01/29/2015  . Protein-calorie malnutrition ([place]) 01/29/2015  . Diabetes ([place]) 10/30/2013  . Constipation 01/14/2013  . Depression 01/14/2013  . Alzheimer's disease 11/10/2012  . Generalized osteoarthrosis, involving multiple sites 11/10/2012  . UTI (lower urinary tract infection) 05/30/2011  . Dysphagia 05/30/2011  . DM (diabetes mellitus) ([place]) 05/30/2011  . Toxic encephalopathy 05/30/2011  . Pneumonia 05/28/2011  . Hypertension 05/28/2011  . Dementia 05/28/2011  . CKD (chronic kidney disease) stage 3, GFR 30-59 ml/min 05/28/2011     Palliative Care Assessment & Plan    1.Code Status:  DNR    Code Status Orders        Start     Ordered   04/09/15 1558  Do not attempt resuscitation (DNR)   Continuous    Question Answer Comment  In the event of cardiac or respiratory ARREST Do not call a "code blue"   In the event of cardiac or respiratory ARREST Do not perform Intubation, CPR, defibrillation or ACLS  In the event of cardiac or respiratory ARREST Use medication by any route, position, wound care, and other measures to relive pain and suffering. May use oxygen, suction and manual treatment of airway obstruction as needed for comfort.      04/09/15 1557       2. Goals of Care/Additional  Recommendations:  Goal is to focus on keeping her comfortable but to treat the treatable.   Desire for further Chaplaincy support:no  Psycho-social Needs: Education on Hospice  3. Symptom Management:      1. No current symptoms. SLP has made recommendations for dysphagia.   4. Palliative Prophylaxis:   Bowel Regimen, Delirium Protocol, Oral Care and Turn Reposition  5. Prognosis: < 6 months  6. Discharge Planning:  [place] with Hospice   Care plan was discussed with Dr. Eliseo Squires.   Thank you for allowing the Palliative Medicine Team to assist in the care of this patient.   Time In: 1145 Time Out: 4731 Total Time 20MIN Prolonged Time Billed  no         Pershing Proud, NP  92/43/8365, 12:03 PM  Please contact Palliative Medicine Team phone at [phone] for questions and concerns.

## 2015-04-12 NOTE — Progress Notes (Signed)
X ray appears to show volume overload- will give IV lasix and monitor

## 2015-04-12 NOTE — Care Management Note (Signed)
Case Management Note  Patient Details  Name: Adrienne Bailey MRN: [id number] Date of Birth: 1923-09-20  Subjective/Objective:      Per palliative note plan is for snf with hospice,  CSW aware.                Action/Plan:   Expected Discharge Date:                  Expected Discharge Plan:  Skilled Nursing Facility  In-House Referral:  Clinical Social Work  Discharge planning Services  CM Consult  Post Acute Care Choice:    Choice offered to:     DME Arranged:    DME Agency:     HH Arranged:    HH Agency:     Status of Service:  In process, will continue to follow  Medicare Important Message Given:  Yes Date Medicare IM Given:    Medicare IM give by:    Date Additional Medicare IM Given:    Additional Medicare Important Message give by:     If discussed at Long Length of Stay Meetings, dates discussed:    Additional Comments:  Leone Havenaylor, Shawnte Demarest Clinton, RN 04/12/2015, 1:24 PM

## 2015-04-12 NOTE — Progress Notes (Signed)
Patient found to have BP=162/38 and temp=102.8; BP was rechecked by RN manually and found to be 165/52 and temp=102.6. MD informed and Tylenol 650 mg rectally was given. Now, temp=100.6. Will continue to monitor.

## 2015-04-12 NOTE — NC FL2 (Signed)
[place] MEDICAID FL2 LEVEL OF CARE SCREENING TOOL     IDENTIFICATION  Patient Name: Adrienne Bailey Birthdate: [birth year] Sex: female Admission Date (Current Location): 04/08/2015  [facility] and [place] Number: Producer, television/film/video and Address:  [facility], [address] [place], [place] [address]      Provider Number: [id number]  Attending Physician Name and Address:  Joseph Art, DO  Relative Name and Phone Number:       Current Level of Care: Hospital Recommended Level of Care: Skilled Nursing Facility Prior Approval Number:    Date Approved/Denied:   PASRR Number: [id number]  Discharge Plan: SNF    Current Diagnoses: Patient Active Problem List   Diagnosis Date Noted  . Palliative care encounter   . Pressure ulcer 04/09/2015  . Bacteremia 04/09/2015  . Sepsis ([facility]) 04/08/2015  . Lactic acidosis 04/08/2015  . Chronic kidney disease, unspecified ([facility])   . Legal blindness, as defined in [place]   . Recurrent UTI 03/29/2015  . Diabetes mellitus with peripheral vascular disease ([facility]) 01/29/2015  . PAD (peripheral artery disease) ([facility]) 01/29/2015  . Protein-calorie malnutrition ([facility]) 01/29/2015  . Diabetes ([facility]) 10/30/2013  . Constipation 01/14/2013  . Depression 01/14/2013  . Alzheimer's disease 11/10/2012  . Generalized osteoarthrosis, involving multiple sites 11/10/2012  . UTI (lower urinary tract infection) 05/30/2011  . Dysphagia 05/30/2011  . DM (diabetes mellitus) ([facility]) 05/30/2011  . Toxic encephalopathy 05/30/2011  . Pneumonia 05/28/2011  . Hypertension 05/28/2011  . Dementia 05/28/2011  . CKD (chronic kidney disease) stage 3, GFR 30-59 ml/min 05/28/2011    Orientation ACTIVITIES/SOCIAL BLADDER RESPIRATION     (DOx4)    Incontinent Normal  BEHAVIORAL SYMPTOMS/MOOD NEUROLOGICAL BOWEL NUTRITION STATUS      Incontinent Diet (dyshasia I)  PHYSICIAN VISITS COMMUNICATION OF NEEDS Height & Weight Skin    Verbally   125 lbs.  PU Stage and Appropriate Care          AMBULATORY STATUS RESPIRATION    Assist extensive Normal      Personal Care Assistance Level of Assistance  Total care       Total Care Assistance: Maximum assistance    Functional Limitations Info                SPECIAL CARE FACTORS FREQUENCY                      Additional Factors Info  Code Status, Allergies, Insulin Sliding Scale Code Status Info: DNR Allergies Info: ppd, tape   Insulin Sliding Scale Info: 4x/day       Current Medications (04/12/2015): Current Facility-Administered Medications  Medication Dose Route Frequency Provider Last Rate Last Dose  . 0.45 % sodium chloride infusion   Intravenous Continuous Joseph Art, DO 50 mL/hr at 04/12/15 0802 1,000 mL at 04/12/15 0802  . acetaminophen (TYLENOL) tablet 650 mg  650 mg Oral Q6H PRN Gwenyth Bender, NP       Or  . acetaminophen (TYLENOL) suppository 650 mg  650 mg Rectal Q6H PRN Gwenyth Bender, NP   650 mg at 04/08/15 1837  . albuterol (PROVENTIL) (2.5 MG/3ML) 0.083% nebulizer solution 2.5 mg  2.5 mg Nebulization Q2H PRN Joseph Art, DO      . amLODipine (NORVASC) tablet 5 mg  5 mg Oral Daily Vernona Rieger A [place], PA-C   5 mg at 04/12/15 1013  . antiseptic oral rinse (CPC / CETYLPYRIDINIUM CHLORIDE 0.05%) solution 7 mL  7 mL Mouth Rinse q12n4p Costin Otelia Sergeant, MD   7 mL at 04/11/15 1600  . cefTRIAXone (ROCEPHIN) 2 g in dextrose 5 % 50 mL IVPB  2 g Intravenous Q24H Joseph Art, DO   2 g at 04/12/15 1016  . chlorhexidine (PERIDEX) 0.12 % solution 15 mL  15 mL Mouth Rinse BID Leatha Gilding, MD   15 mL at 04/12/15 1013  . enoxaparin (LOVENOX) injection 30 mg  30 mg Subcutaneous Q24H Lesle Chris Black, NP   30 mg at 04/11/15 1832  . hydroxypropyl methylcellulose / hypromellose (ISOPTO TEARS / GONIOVISC) 2.5 % ophthalmic solution 1 drop  1 drop Both Eyes QID Gwenyth Bender, NP   1 drop at 04/12/15 1015  . insulin aspart (novoLOG) injection 0-15 Units  0-15 Units  Subcutaneous TID WC Gwenyth Bender, NP   2 Units at 04/11/15 1831  . insulin aspart (novoLOG) injection 0-5 Units  0-5 Units Subcutaneous QHS Gwenyth Bender, NP   2 Units at 04/08/15 2201  . ondansetron (ZOFRAN) tablet 4 mg  4 mg Oral Q6H PRN Gwenyth Bender, NP       Or  . ondansetron [facility]) injection 4 mg  4 mg Intravenous Q6H PRN Gwenyth Bender, NP       Do not use this list as official medication orders. Please verify with discharge summary.  Discharge Medications:   Medication List    ASK your doctor about these medications        acetaminophen 325 MG tablet  Commonly known as:  TYLENOL  Take 650 mg by mouth 2 (two) times daily. For pain     albuterol (5 MG/ML) 0.5% nebulizer solution  Commonly known as:  PROVENTIL  Take 0.5 mLs (2.5 mg total) by nebulization every 4 (four) hours as needed for wheezing or shortness of breath.     amLODipine 5 MG tablet  Commonly known as:  NORVASC  Take 5 mg by mouth daily.     aspirin 81 MG chewable tablet  Chew 81 mg by mouth daily. Take 1 tablet by mouth daily     calcium-vitamin D 500-200 MG-UNIT tablet  Commonly known as:  OSCAL WITH D  Take 1 tablet by mouth 3 (three) times daily.     Cranberry 405 MG Caps  Take 405 mg by mouth 2 (two) times daily.     DECUBI-VITE PO  Take by mouth. 1 by mouth daily for wound healing     escitalopram 20 MG tablet  Commonly known as:  LEXAPRO  Take 20 mg by mouth daily.     galantamine 16 MG 24 hr capsule  Commonly known as:  RAZADYNE ER  Take 16 mg by mouth daily with breakfast.     hydroxypropyl methylcellulose / hypromellose 2.5 % ophthalmic solution  Commonly known as:  ISOPTO TEARS / GONIOVISC  Place 1 drop into both eyes 4 (four) times daily.     INSTA-GLUCOSE 40 % Gel  Generic drug:  dextrose  Take 1 Tube by mouth. If fingerstick blood sugar is less than 60 and resident can swallow give glucose gel and recheck  In 30 minutes and continue hypoglycemia.     insulin aspart 100 UNIT/ML  injection  Commonly known as:  novoLOG  Inject 5 Units into the skin daily as needed for high blood sugar. For CBG>250     insulin glargine 100 UNIT/ML injection  Commonly known as:  LANTUS  Inject 18 Units into the  skin at bedtime. * do not mix with other insulins *     ipratropium 0.02 % nebulizer solution  Commonly known as:  ATROVENT  Take 2.5 mLs (0.5 mg total) by nebulization every 4 (four) hours as needed.     NAMENDA XR 28 MG Cp24 24 hr capsule  Generic drug:  memantine  Take 28 mg by mouth daily.     NUTRITIONAL SHAKE PO  Take by mouth. Mighty shake three times daily with med pass 10a, 2p, 8p     prednisoLONE acetate 1 % ophthalmic suspension  Commonly known as:  PRED FORTE  Place 1 drop into the left eye daily after breakfast.     PROCEL Powd  Take by mouth. Give 1 scoop twice daily for nutritional support and to increase protein     sennosides-docusate sodium 8.6-50 MG tablet  Commonly known as:  SENOKOT-S  Take 2 tablets by mouth at bedtime.        Relevant Imaging Results:  Relevant Lab Results:  Recent Labs    Additional Information    Izora RibasHoloman, Bita Cartwright M, LCSW

## 2015-04-12 NOTE — Clinical Social Work Note (Signed)
Clinical Social Work Assessment  Patient Details  Name: Adrienne Bailey MRN: [id number] Date of Birth: 30-Jan-1924  Date of referral:  04/12/15               Reason for consult:  Facility Placement                Permission sought to share information with:  Family Supports, Magazine features editoracility Contact Representative Permission granted to share information::   (pt DO- son Carlyon Prowsatrick Morgan is HCPOA)  Name::     Carlyon ProwsPatrick Morgan  Agency::  [facility]  Relationship::  son/HCPOA  Contact Information:     Housing/Transportation Living arrangements for the past 2 months:  [facility]illed [facility] Facility Source of Information:  Adult Children Patient Interpreter Needed:  None Criminal Activity/Legal Involvement Pertinent to Current Situation/Hospitalization:  No - Comment as needed Significant Relationships:  Adult Children Lives with:  Facility Resident Do you feel safe going back to the place where you live?  Yes Need for family participation in patient care:  Yes (Comment) (decision making)  Care giving concerns:  None- [facility]   Social Worker assessment / plan:  CSW spoke with son concerning [facility]  Employment status:  Retired Health and safety inspectornsurance information:  Medicare PT Recommendations:  Not assessed at this time Information / Referral to community resources:  Skilled Nursing Facility  Patient/Family's Response to care: Pt son reports that plan is for pt to return to [facility] at time of DC-  States pt has been resident there for 4 years and they feel comfortable with her care there.  Patient/Family's Understanding of and Emotional Response to Diagnosis, Current Treatment, and Prognosis: No questions or concerns at this time.  Emotional Assessment Appearance:  Appears stated age Attitude/Demeanor/Rapport:  Unable to Assess Affect (typically observed):  Unable to Assess Orientation:  Fluctuating Orientation (Suspected and/or reported  Sundowners) Alcohol / Substance use:  Not Applicable Psych involvement (Current and /or in the community):  No (Comment)  Discharge Needs  Concerns to be addressed:  Care Coordination Readmission within the last 30 days:  No Current discharge risk:  None Barriers to Discharge:  Continued Medical Work up   Peabody EnergyHoloman, Adryan Druckenmiller M, LCSW 04/12/2015, 10:52 AM

## 2015-04-13 LAB — GLUCOSE, CAPILLARY
Glucose-Capillary: 107 mg/dL — ABNORMAL HIGH (ref 65–99)
Glucose-Capillary: 143 mg/dL — ABNORMAL HIGH (ref 65–99)
Glucose-Capillary: 138 mg/dL — ABNORMAL HIGH (ref 65–99)

## 2015-04-13 LAB — CULTURE, BLOOD (ROUTINE X 2)
CULTURE: NO GROWTH
Culture: NO GROWTH

## 2015-04-13 MED ORDER — GLYCOPYRROLATE 0.2 MG/ML IJ SOLN
0.2000 mg | INTRAMUSCULAR | Status: DC | PRN
  Filled 2015-04-13: qty 1

## 2015-04-13 MED ORDER — PIPERACILLIN-TAZOBACTAM 3.375 G IVPB
3.3750 g | Freq: Three times a day (TID) | INTRAVENOUS | Status: DC
  Administered 2015-04-13: 3.375 g via INTRAVENOUS
  Filled 2015-04-13 (×2): qty 50

## 2015-04-13 MED ORDER — GLYCOPYRROLATE 0.2 MG/ML IJ SOLN: 0.2000 mg | INTRAMUSCULAR | Status: AC | PRN

## 2015-04-13 MED ORDER — FUROSEMIDE 10 MG/ML IJ SOLN
40.0000 mg | Freq: Once | INTRAMUSCULAR | Status: AC
  Administered 2015-04-13: 40 mg via INTRAVENOUS
  Filled 2015-04-13: qty 4

## 2015-04-13 MED ORDER — MORPHINE SULFATE (CONCENTRATE) 10 MG/0.5ML PO SOLN: 5.0000 mg | ORAL | Status: DC | PRN

## 2015-04-13 MED ORDER — MORPHINE SULFATE (CONCENTRATE) 10 MG/0.5ML PO SOLN: 5.0000 mg | ORAL | Status: AC | PRN

## 2015-04-13 MED ORDER — CETYLPYRIDINIUM CHLORIDE 0.05 % MT LIQD: 7.0000 mL | Freq: Two times a day (BID) | OROMUCOSAL | Status: AC

## 2015-04-13 NOTE — Progress Notes (Signed)
ANTIBIOTIC CONSULT NOTE  Pharmacy Consult for Zosyn Indication: sepsis  Allergies  Allergen Reactions  . Ppd [Tuberculin Purified Protein Derivative]   . Tape     Patient Measurements: Height: 5\' 2"  (157.5 cm) Weight: 125 lb (56.7 kg) IBW/kg (Calculated) : 50.1  Vital Signs: Temp: 101.2 F (38.4 C) (11/22 0459) BP: 142/77 mmHg (11/22 0459) Pulse Rate: 102 (11/22 0459) Intake/Output from previous day: 11/21 0701 - 11/22 0700 In: 450 [I.V.:450] Out: -  Intake/Output from this shift:    Labs:  Recent Labs  04/11/15 0021 04/12/15 0523  WBC 20.2* 9.5  HGB 12.0 10.9*  PLT 203 130*  CREATININE 1.26* 1.15*   Estimated Creatinine Clearance: 26.2 mL/min (by C-G formula based on Cr of 1.15). No results for input(s): VANCOTROUGH, VANCOPEAK, VANCORANDOM, GENTTROUGH, GENTPEAK, GENTRANDOM, TOBRATROUGH, TOBRAPEAK, TOBRARND, AMIKACINPEAK, AMIKACINTROU, AMIKACIN in the last 72 hours.   Microbiology: Recent Results (from the past 720 hour(s))  Culture, blood (routine x 2)     Status: None   Collection Time: 04/08/15 11:11 AM  Result Value Ref Range Status   Specimen Description BLOOD RIGHT FOREARM  Final   Special Requests BOTTLES DRAWN AEROBIC AND ANAEROBIC 10ML  Final   Culture  Setup Time   Final    GRAM NEGATIVE RODS AEROBIC BOTTLE ONLY CRITICAL RESULT CALLED TO, READ BACK BY AND VERIFIED WITH: R BROWN @0659  MKELLY 04/09/15    Culture KLEBSIELLA PNEUMONIAE  Final   Report Status 04/12/2015 FINAL  Final   Organism ID, Bacteria KLEBSIELLA PNEUMONIAE  Final      Susceptibility   Klebsiella pneumoniae - MIC*    AMPICILLIN <=2 RESISTANT Resistant     CEFAZOLIN <=4 SENSITIVE Sensitive     CEFEPIME <=1 SENSITIVE Sensitive     CEFTAZIDIME <=1 SENSITIVE Sensitive     CEFTRIAXONE <=1 SENSITIVE Sensitive     CIPROFLOXACIN <=0.25 SENSITIVE Sensitive     GENTAMICIN <=1 SENSITIVE Sensitive     IMIPENEM 0.5 SENSITIVE Sensitive     TRIMETH/SULFA <=20 SENSITIVE Sensitive    AMPICILLIN/SULBACTAM <=2 SENSITIVE Sensitive     PIP/TAZO <=4 SENSITIVE Sensitive     * KLEBSIELLA PNEUMONIAE  Culture, blood (routine x 2)     Status: None (Preliminary result)   Collection Time: 04/08/15 11:35 AM  Result Value Ref Range Status   Specimen Description BLOOD LEFT HAND  Final   Special Requests BOTTLES DRAWN AEROBIC ONLY 8ML  Final   Culture NO GROWTH 4 DAYS  Final   Report Status PENDING  Incomplete  MRSA PCR Screening     Status: None   Collection Time: 04/08/15  5:40 PM  Result Value Ref Range Status   MRSA by PCR NEGATIVE NEGATIVE Final    Comment:        The GeneXpert MRSA Assay (FDA approved for NASAL specimens only), is one component of a comprehensive MRSA colonization surveillance program. It is not intended to diagnose MRSA infection nor to guide or monitor treatment for MRSA infections.   Culture, blood (x 2)     Status: None   Collection Time: 04/08/15  7:00 PM  Result Value Ref Range Status   Specimen Description BLOOD LEFT HAND  Final   Special Requests IN PEDIATRIC BOTTLE 1CC  Final   Culture  Setup Time   Final    GRAM NEGATIVE COCCOBACILLI AEROBIC BOTTLE ONLY CRITICAL RESULT CALLED TO, READ BACK BY AND VERIFIED WITH: C SCHILLER 04/09/15 @ 1244 M VESTAL    Culture   Final  KLEBSIELLA PNEUMONIAE SUSCEPTIBILITIES PERFORMED ON PREVIOUS CULTURE WITHIN THE LAST 5 DAYS.    Report Status 04/12/2015 FINAL  Final  Culture, blood (x 2)     Status: None (Preliminary result)   Collection Time: 04/08/15  7:10 PM  Result Value Ref Range Status   Specimen Description BLOOD LEFT HAND  Final   Special Requests BOTTLES DRAWN AEROBIC AND ANAEROBIC 5CC   Final   Culture NO GROWTH 4 DAYS  Final   Report Status PENDING  Incomplete  Culture, Urine     Status: None   Collection Time: 04/09/15  8:15 AM  Result Value Ref Range Status   Specimen Description URINE, CATHETERIZED  Final   Special Requests NONE  Final   Culture MULTIPLE SPECIES PRESENT, SUGGEST  RECOLLECTION  Final   Report Status 04/11/2015 FINAL  Final  Culture, blood (routine x 2)     Status: None (Preliminary result)   Collection Time: 04/12/15 12:10 PM  Result Value Ref Range Status   Specimen Description BLOOD LEFT HAND  Final   Special Requests IN PEDIATRIC BOTTLE 3CC  Final   Culture PENDING  Incomplete   Report Status PENDING  Incomplete    Assessment: Adrienne Bailey with AMS. Initially started on vancomycin + zosyn then narrowed to ceftriaxone. Broadening back to zosyn due to continued fevers for possible aspiration pneumonia. Tmax is 102.8, WBC is WNL. Scr has been trending down.  vanc 11/17>>11/18 zosyn 11/17>>11/20; 11/22>> Ceftriaxone 11/20>>11/22  11/17 BCx - Klebsiella 11/17 BCx - NGTD 11/21 BCx - NGTD  MRSA PCR neg  Goal of Therapy:  Eradication of infection  Plan:  - Zosyn 3.375gm IV Q8H (4 hr inf) - F/u renal fxn, C&S, clinical status   Lysle Pearl, PharmD, BCPS Pager # [phone] 04/13/2015 10:45 AM

## 2015-04-13 NOTE — Progress Notes (Signed)
Pt oral temp 101.2 @ 0500. Gave Tylenol rectally @ 0524. Rectal temp 99.8 @ 0600. Will continue to monitor.

## 2015-04-13 NOTE — Discharge Summary (Signed)
Physician Discharge Summary  Adrienne Bailey UJW:[id number] DOB: 1923/08/26 DOA: 04/08/2015  PCP: Oneal Grout, MD  Admit date: 04/08/2015 Discharge date: 04/13/2015  Time spent: 35 minutes  Recommendations for Outpatient Follow-up:  1. To residential hospice   Discharge Diagnoses:  Principal Problem:   Sepsis (HCC) Active Problems:   Hypertension   Dementia   UTI (lower urinary tract infection)   Dysphagia   Toxic encephalopathy   Alzheimer's disease   Diabetes (HCC)   Protein-calorie malnutrition (HCC)   Lactic acidosis   Pressure ulcer   Bacteremia   Palliative care encounter   Discharge Condition: terminal  Diet recommendation: as tolerated-- DYS 1 honey thick  Filed Weights   04/08/15 1752  Weight: 56.7 kg (125 lb)    History of present illness:  Adrienne Bailey is a 81 y.o. female with a history of dementia, diabetes, chronic kidney disease, hypertension, legally blind currently long-term resident at [facility] presents to the emergency department from the facility with the chief complaint of altered mental status and hypotension. Workup in the emergency department revealed acute sepsis likely related to UTI.  Information is obtained from the chart as patient is quite demented and quite lethargic no family present. She was sent to the emergency department for increased lethargy and hypotension. Per chart EMS arrived to facility and systolic blood pressure was 60. She was given 700 mils IV fluids and her blood pressure improved. There is no report of recent cough fever chills diarrhea nausea vomiting. He said routine visit at facility 10 days ago indicates that she is a total care and has been sleeping a lot. Chart indicates her oral intake had been good. At that time her resident Orlene Plum was decreased.  Workup in the emergency department reveals initial lactic acid 8.17, initial troponin negative, comprehensive metabolic panel reveals potassium of 3.2 creatinine 1.6 and  glucose of 238, complete blood count with WBCs of 20.5. AST 184, ALT 94.  Upon presentation her blood pressure blood pressure 90/41, rectal temp 99.9 heart rate 96 W. first 21 oxygen saturation level 97% on 2 L.  In the emergency department she is given another 1500 mils of normal saline IV thank and Zosyn are initiated as well.  Hospital Course:  Presented initially with sepsis due to UTI- treated with IV abx, patient was placed on modified diet after swallow eval but unfortunately has a large aspiration event and continued to have fever. Spoke with family who elected to make comfort care and seek residential hospice placement   Procedures:    Consultations:  SLP  palliative care  Discharge Exam: Filed Vitals:   04/12/15 2101 04/13/15 0459  BP: 159/59 142/77  Pulse: 97 102  Temp: 98.6 F (37 C) 101.2 F (38.4 C)  Resp: 16 24    General: eyes close, responds to pain   Discharge Instructions    Current Discharge Medication List    START taking these medications   Details  antiseptic oral rinse (CPC / CETYLPYRIDINIUM CHLORIDE 0.05%) 0.05 % LIQD solution 7 mLs by Mouth Rinse route 2 times daily at 12 noon and 4 pm. Refills: 0    glycopyrrolate (ROBINUL) 0.2 MG/ML injection Inject 1 mL (0.2 mg total) into the vein every 4 (four) hours as needed (secretions, gurgling). Qty: 1 mL    Morphine Sulfate (MORPHINE CONCENTRATE) 10 MG/0.5ML SOLN concentrated solution Take 0.25 mLs (5 mg total) by mouth every 2 (two) hours as needed for severe pain or shortness of breath. Qty: 42 mL  CONTINUE these medications which have NOT CHANGED   Details  hydroxypropyl methylcellulose / hypromellose (ISOPTO TEARS / GONIOVISC) 2.5 % ophthalmic solution Place 1 drop into both eyes 4 (four) times daily.      STOP taking these medications     acetaminophen (TYLENOL) 325 MG tablet      amLODipine (NORVASC) 5 MG tablet      aspirin 81 MG chewable tablet      calcium-vitamin D  (OSCAL WITH D) 500-200 MG-UNIT per tablet      Cranberry 405 MG CAPS      dextrose (INSTA-GLUCOSE) 40 % GEL      escitalopram (LEXAPRO) 20 MG tablet      galantamine (RAZADYNE ER) 16 MG 24 hr capsule      insulin aspart (NOVOLOG) 100 UNIT/ML injection      insulin glargine (LANTUS) 100 UNIT/ML injection      memantine (NAMENDA XR) 28 MG CP24 24 hr capsule      Multiple Vitamins-Minerals (DECUBI-VITE PO)      Nutritional Supplements (NUTRITIONAL SHAKE PO)      prednisoLONE acetate (PRED FORTE) 1 % ophthalmic suspension      Protein (PROCEL) POWD      sennosides-docusate sodium (SENOKOT-S) 8.6-50 MG tablet      albuterol (PROVENTIL) (5 MG/ML) 0.5% nebulizer solution      ipratropium (ATROVENT) 0.02 % nebulizer solution        Allergies  Allergen Reactions  . Ppd [Tuberculin Purified Protein Derivative]   . Tape       The results of significant diagnostics from this hospitalization (including imaging, microbiology, ancillary and laboratory) are listed below for reference.    Significant Diagnostic Studies: Ct Head Wo Contrast  04/08/2015  CLINICAL DATA:  Altered mental status, hypertension EXAM: CT HEAD WITHOUT CONTRAST TECHNIQUE: Contiguous axial images were obtained from the base of the skull through the vertex without intravenous contrast. COMPARISON:  05/27/2011 FINDINGS: No skull fracture is noted. Paranasal sinuses and mastoid air cells are unremarkable. No intracranial hemorrhage, mass effect or midline shift. No acute cortical infarction. No mass lesion is noted on this unenhanced scan. Stable atrophy and chronic white matter disease. Ventricular size is stable from prior exam. IMPRESSION: No acute intracranial abnormality. Stable atrophy and chronic white matter disease. Electronically Signed   By: Natasha MeadLiviu  Pop M.D.   On: 04/08/2015 12:04   Dg Chest Port 1 View  04/12/2015  CLINICAL DATA:  Fever for 1 day.  Sepsis. EXAM: PORTABLE CHEST 1 VIEW COMPARISON:   04/08/2015 FINDINGS: Bilateral airspace disease is new since previous study with probable layering pleural effusions. Mild cardiomegaly remains stable. IMPRESSION: New bilateral airspace disease and probable layering pleural effusions. Electronically Signed   By: Myles RosenthalJohn  Stahl M.D.   On: 04/12/2015 15:49   Dg Chest Portable 1 View  04/08/2015  CLINICAL DATA:  Fever, sepsis, altered mental status EXAM: PORTABLE CHEST 1 VIEW COMPARISON:  05/27/2011 FINDINGS: Stable cardiomegaly and central vascular congestion. Similar low lung volumes with left basilar streaky opacities compatible with scarring. No significant interval change or new finding. No superimposed CHF or edema. Negative for pneumothorax. Trachea is midline. Atherosclerosis of the aorta. Degenerative changes noted of the spine. Prior cholecystectomy evident. IMPRESSION: Cardiomegaly without CHF Low volume exam with similar left basilar chronic atelectasis/ scarring. Electronically Signed   By: Judie PetitM.  Shick M.D.   On: 04/08/2015 11:26    Microbiology: Recent Results (from the past 240 hour(s))  Culture, blood (routine x 2)  Status: None   Collection Time: 04/08/15 11:11 AM  Result Value Ref Range Status   Specimen Description BLOOD RIGHT FOREARM  Final   Special Requests BOTTLES DRAWN AEROBIC AND ANAEROBIC  Final   Culture  Setup Time   Final    GRAM NEGATIVE RODS AEROBIC BOTTLE ONLY CRITICAL RESULT CALLED TO, READ BACK BY AND VERIFIED WITH: R BROWN  MKELLY 04/09/15    Culture KLEBSIELLA PNEUMONIAE  Final   Report Status 04/12/2015 FINAL  Final   Organism ID, Bacteria KLEBSIELLA PNEUMONIAE  Final      Susceptibility   Klebsiella pneumoniae - MIC*    AMPICILLIN <=2 RESISTANT Resistant     CEFAZOLIN <=4 SENSITIVE Sensitive     CEFEPIME <=1 SENSITIVE Sensitive     CEFTAZIDIME <=1 SENSITIVE Sensitive     CEFTRIAXONE <=1 SENSITIVE Sensitive     CIPROFLOXACIN <=0.25 SENSITIVE Sensitive     GENTAMICIN <=1 SENSITIVE Sensitive      IMIPENEM 0.5 SENSITIVE Sensitive     TRIMETH/SULFA <=20 SENSITIVE Sensitive     AMPICILLIN/SULBACTAM <=2 SENSITIVE Sensitive     PIP/TAZO <=4 SENSITIVE Sensitive     * KLEBSIELLA PNEUMONIAE  Culture, blood (routine x 2)     Status: None   Collection Time: 04/08/15 11:35 AM  Result Value Ref Range Status   Specimen Description BLOOD LEFT HAND  Final   Special Requests BOTTLES DRAWN AEROBIC ONLY  Final   Culture NO GROWTH 5 DAYS  Final   Report Status 04/13/2015 FINAL  Final  MRSA PCR Screening     Status: None   Collection Time: 04/08/15  5:40 PM  Result Value Ref Range Status   MRSA by PCR NEGATIVE NEGATIVE Final    Comment:        The GeneXpert MRSA Assay (FDA approved for NASAL specimens only), is one component of a comprehensive MRSA colonization surveillance program. It is not intended to diagnose MRSA infection nor to guide or monitor treatment for MRSA infections.   Culture, blood (x 2)     Status: None   Collection Time: 04/08/15  7:00 PM  Result Value Ref Range Status   Specimen Description BLOOD LEFT HAND  Final   Special Requests IN PEDIATRIC BOTTLE 1CC  Final   Culture  Setup Time   Final    GRAM NEGATIVE COCCOBACILLI AEROBIC BOTTLE ONLY CRITICAL RESULT CALLED TO, READ BACK BY AND VERIFIED WITH: C SCHILLER 04/09/15 @ 1244 M VESTAL    Culture   Final    KLEBSIELLA PNEUMONIAE SUSCEPTIBILITIES PERFORMED ON PREVIOUS CULTURE WITHIN THE LAST 5 DAYS.    Report Status 04/12/2015 FINAL  Final  Culture, blood (x 2)     Status: None   Collection Time: 04/08/15  7:10 PM  Result Value Ref Range Status   Specimen Description BLOOD LEFT HAND  Final   Special Requests BOTTLES DRAWN AEROBIC AND ANAEROBIC 5CC   Final   Culture NO GROWTH 5 DAYS  Final   Report Status 04/13/2015 FINAL  Final  Culture, Urine     Status: None   Collection Time: 04/09/15  8:15 AM  Result Value Ref Range Status   Specimen Description URINE, CATHETERIZED  Final   Special Requests NONE   Final   Culture MULTIPLE SPECIES PRESENT, SUGGEST RECOLLECTION  Final   Report Status 04/11/2015 FINAL  Final  Culture, blood (routine x 2)     Status: None (Preliminary result)   Collection Time: 04/12/15 12:10 PM  Result Value Ref  Range Status   Specimen Description BLOOD LEFT HAND  Final   Special Requests IN PEDIATRIC BOTTLE 3CC  Final   Culture NO GROWTH < 24 HOURS  Final   Report Status PENDING  Incomplete     Labs: Basic Metabolic Panel:  Recent Labs Lab 04/08/15 1044 04/09/15 1040 04/10/15 0410 04/11/15 0021 04/12/15 0523  NA 141 145 145 146* 146*  K 3.2* 3.9 4.4 4.5 3.8  CL 108 119* 120* 123* 121*  CO2 20* 21* 15* 15* 15*  GLUCOSE 238* 149* 105* 140* 137*  BUN 16 19 18 18 13   CREATININE 1.62* 1.36* 1.33* 1.26* 1.15*  CALCIUM 8.9 7.7* 7.6* 7.8* 8.1*   Liver Function Tests:  Recent Labs Lab 04/08/15 1044 04/09/15 1040  AST 184* 67*  ALT 94* 60*  ALKPHOS 83 74  BILITOT 0.3 0.7  PROT 6.3* 5.6*  ALBUMIN 2.6* 2.1*   No results for input(s): LIPASE, AMYLASE in the last 168 hours. No results for input(s): AMMONIA in the last 168 hours. CBC:  Recent Labs Lab 04/08/15 1044 04/09/15 0750 04/10/15 0410 04/11/15 0021 04/12/15 0523  WBC 20.5* 22.9* 18.3* 20.2* 9.5  NEUTROABS 19.0*  --   --   --   --   HGB 12.4 10.3* 11.2* 12.0 10.9*  HCT 37.2 30.9* 33.7* 35.2* 33.0*  MCV 91.9 90.1 91.1 90.7 88.9  PLT 197 169 162 203 130*   Cardiac Enzymes: No results for input(s): CKTOTAL, CKMB, CKMBINDEX, TROPONINI in the last 168 hours. BNP: BNP (last 3 results) No results for input(s): BNP in the last 8760 hours.  ProBNP (last 3 results) No results for input(s): PROBNP in the last 8760 hours.  CBG:  Recent Labs Lab 04/12/15 1225 04/12/15 1648 04/12/15 2140 04/13/15 0556 04/13/15 0757  GLUCAP 110* 126* 107* 143* 138*       Signed:  Foxx Klarich  [facility] 04/13/2015, 1:16 PM

## 2015-04-13 NOTE — Progress Notes (Signed)
Pt prepared for d/c to hospice. IV d/c'd. Skin intact except as charted in most recent assessments. Vitals are stable. Report called to receiving facility. Pt to be transported by ambulance service.

## 2015-04-13 NOTE — Care Management Note (Signed)
Case Management Note  Patient Details  Name: Jenna Luodna Morash MRN: [id number] Date of Birth: 01-03-1924  Subjective/Objective:    Patient is for [facility], CSW following.                Action/Plan:   Expected Discharge Date:                  Expected Discharge Plan:  Hospice Medical Facility  In-House Referral:  Clinical Social Work  Discharge planning Services  CM Consult  Post Acute Care Choice:  Hospice Choice offered to:     DME Arranged:    DME Agency:     HH Arranged:    HH Agency:     Status of Service:  Completed, signed off  Medicare Important Message Given:  Yes Date Medicare IM Given:    Medicare IM give by:    Date Additional Medicare IM Given:    Additional Medicare Important Message give by:     If discussed at Long Length of Stay Meetings, dates discussed:    Additional Comments:  Leone Havenaylor, Verneice Caspers [place], RN 04/13/2015, 1:23 PM

## 2015-04-13 NOTE — Progress Notes (Signed)
[facility] Anticipated discharge date:11/22 Transportation by [facility]- scheduled for 2pm  CSW signing off.  Merlyn LotJenna Holoman, LCSWA Clinical Social Worker [phone]

## 2015-04-14 NOTE — Progress Notes (Signed)
Palliative:  Ms. Adrienne Bailey has declined over night. Son, Adrienne Bailey, is at bedside and understands that she is now at end of life and requesting comfort care for her. He says that he [place] in [place] and would prefer for [facility]. He is awaiting to meet with hospice liaison as he notifies the rest of his family. Full comfort care now.   No Charge  Adrienne ChannelAlicia Ainhoa Rallo, NP Palliative Medicine Team Pager # [phone] (M-F 8a-5p) Team Phone # [phone] (Nights/Weekends)

## 2015-04-15 ENCOUNTER — Telehealth (HOSPITAL_COMMUNITY): Payer: Self-pay

## 2015-04-15 NOTE — Telephone Encounter (Signed)
Call transferred to Dr A. Feliz-Ortiz for critical lab result on on pt.

## 2015-04-17 LAB — CULTURE, BLOOD (ROUTINE X 2)

## 2015-05-23 DEATH — deceased

## 2016-01-03 IMAGING — CT CT HEAD W/O CM
2 series · 15 of 30 positions shown, 17 images · non-contrast
Comparison: 05/27/2011

CLINICAL DATA: Altered mental status, hypertension

EXAM:
CT HEAD WITHOUT CONTRAST
TECHNIQUE: Contiguous axial images were obtained from the base of the skull
through the vertex without intravenous contrast.

[Series 2: head without · axial · non-contrast · 0.46mm/px · z∈[-79,+41]mm · 7 of 34 slices shown, 9 images]
[im 5/34  brain]
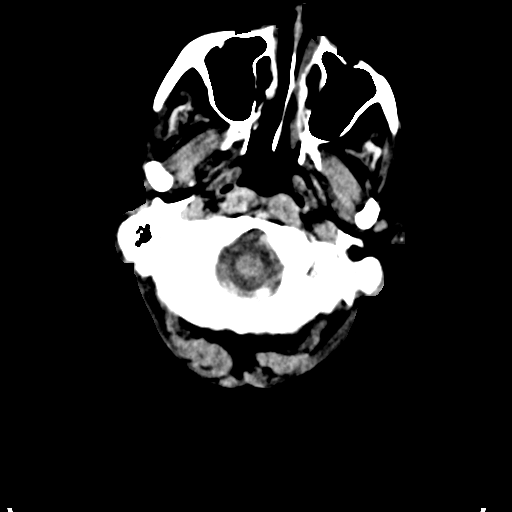
[im 5/34  bone]
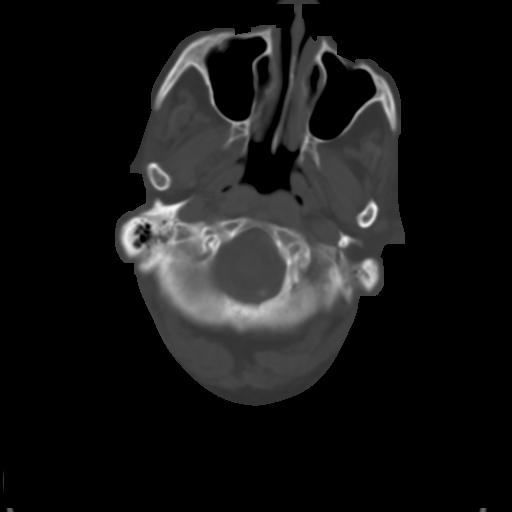
[im 9/34  brain]
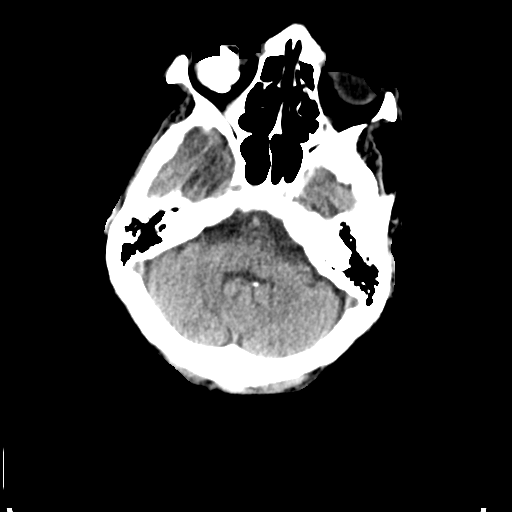
[im 13/34  brain]
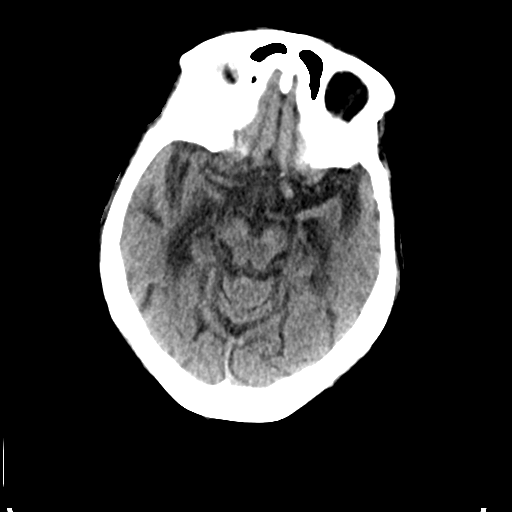
[im 17/34  brain]
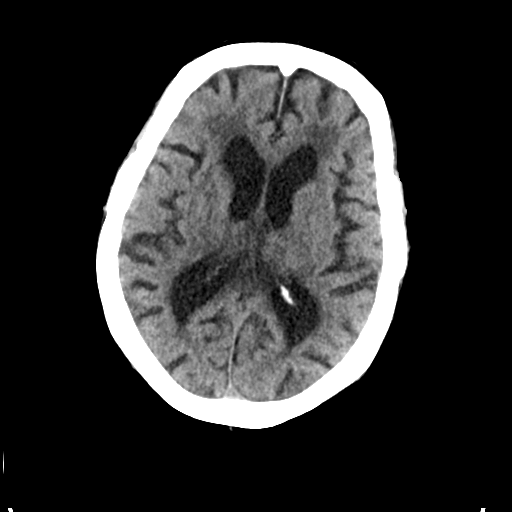
[im 21/34  brain]
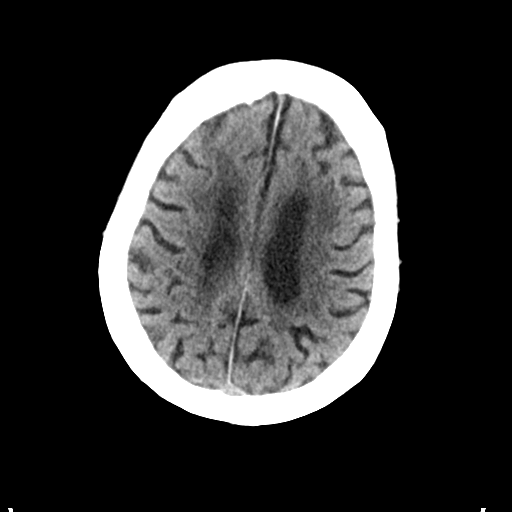
[im 21/34  bone]
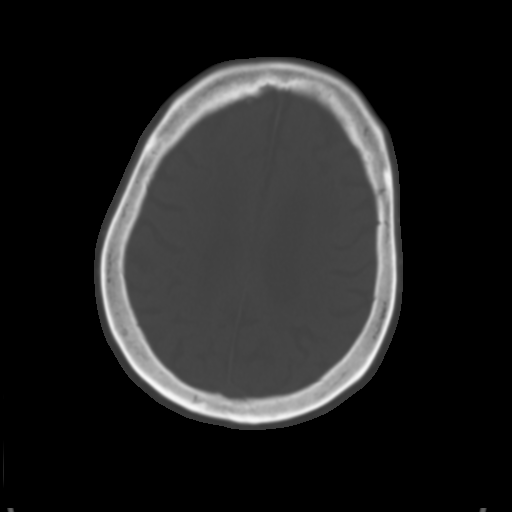
[im 25/34  brain]
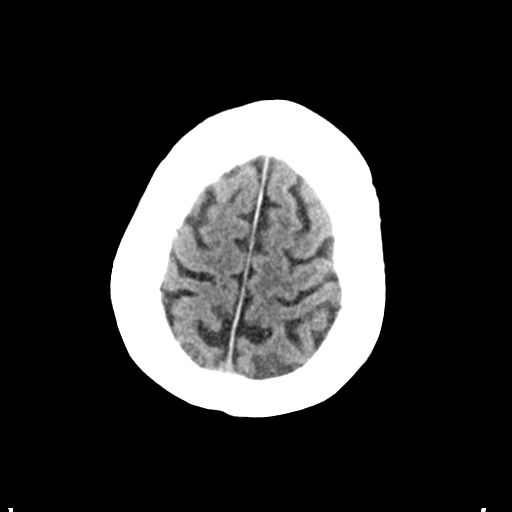
[im 29/34  brain]
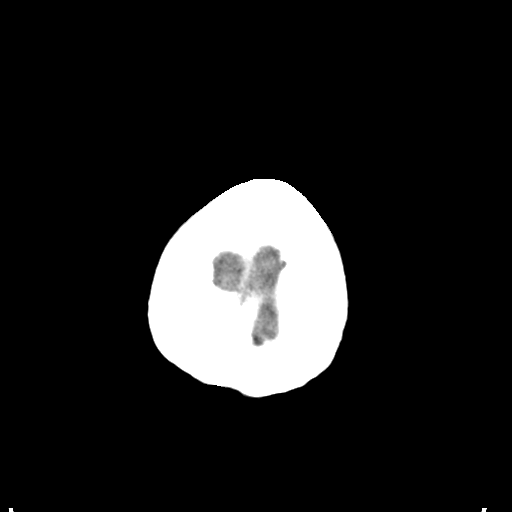

[Series 3: head bone · axial · 0.46mm/px · z∈[-83,+49]mm · 8 of 84 slices shown]
[im 9/84  bone]
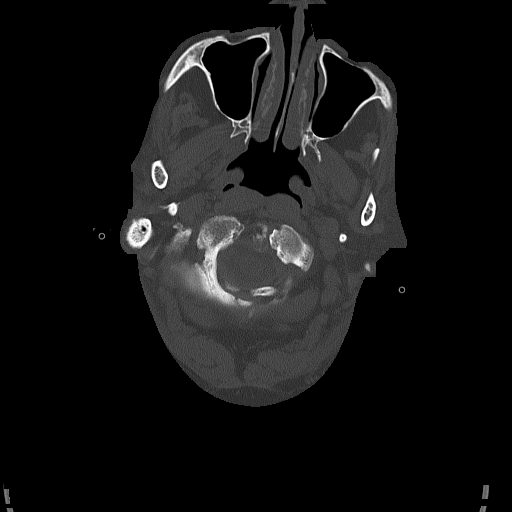
[im 17/84  bone]
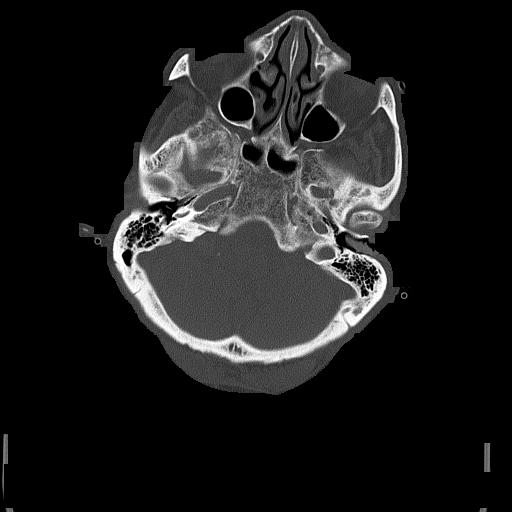
[im 25/84  bone]
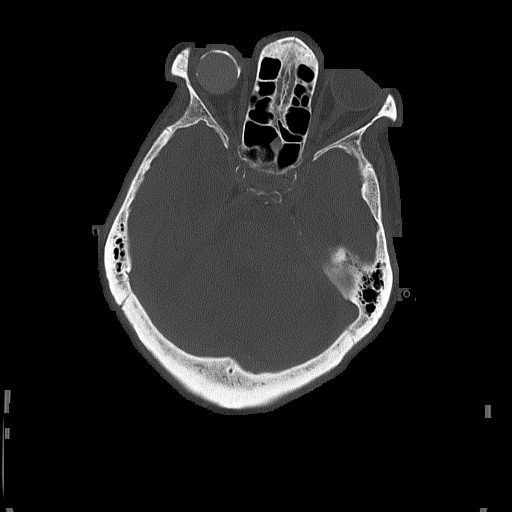
[im 38/84  bone]
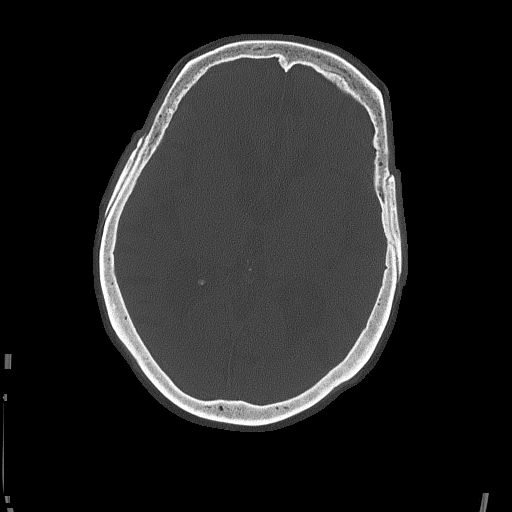
[im 46/84  bone]
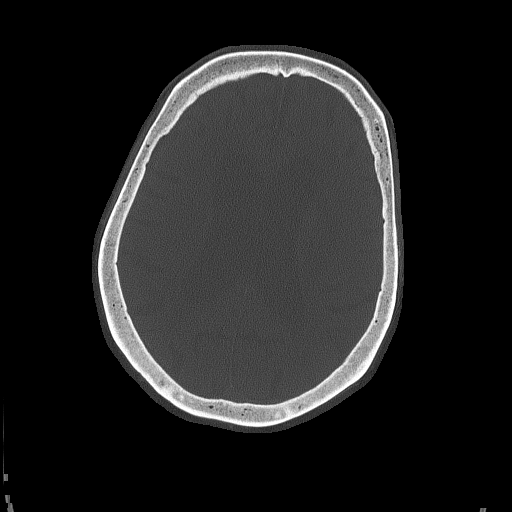
[im 59/84  bone]
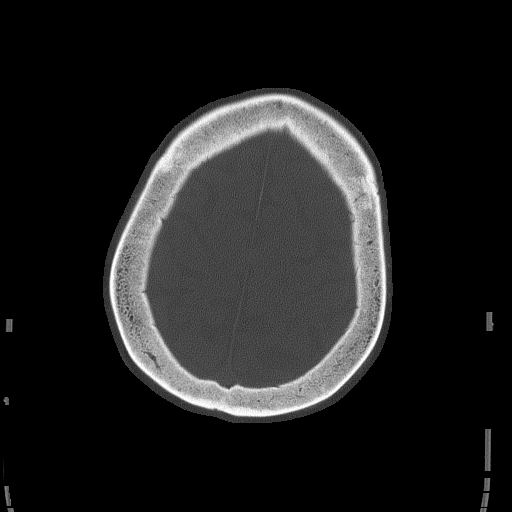
[im 67/84  bone]
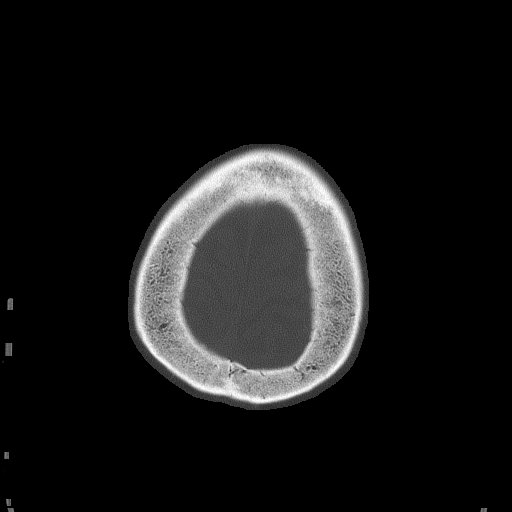
[im 75/84  bone]
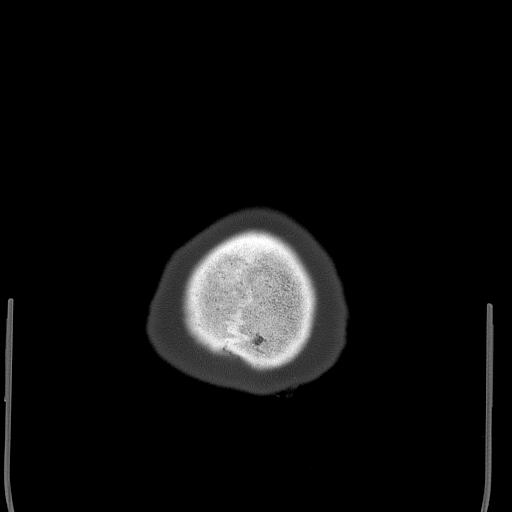

[15 of 30 positions shown; findings below may reference images not displayed]

FINDINGS: No skull fracture is noted. Paranasal sinuses and mastoid air cells
are unremarkable.

No intracranial hemorrhage, mass effect or midline shift. No acute
cortical infarction. No mass lesion is noted on this unenhanced
scan. Stable atrophy and chronic white matter disease. Ventricular
size is stable from prior exam.
IMPRESSION: No acute intracranial abnormality. Stable atrophy and chronic white
matter disease.

## 2016-01-03 IMAGING — CR DG CHEST 1V PORT
1 series · 1 of 1 positions shown · non-contrast
Comparison: 05/27/2011

CLINICAL DATA: Fever, sepsis, altered mental status

EXAM:
PORTABLE CHEST 1 VIEW

[AP]
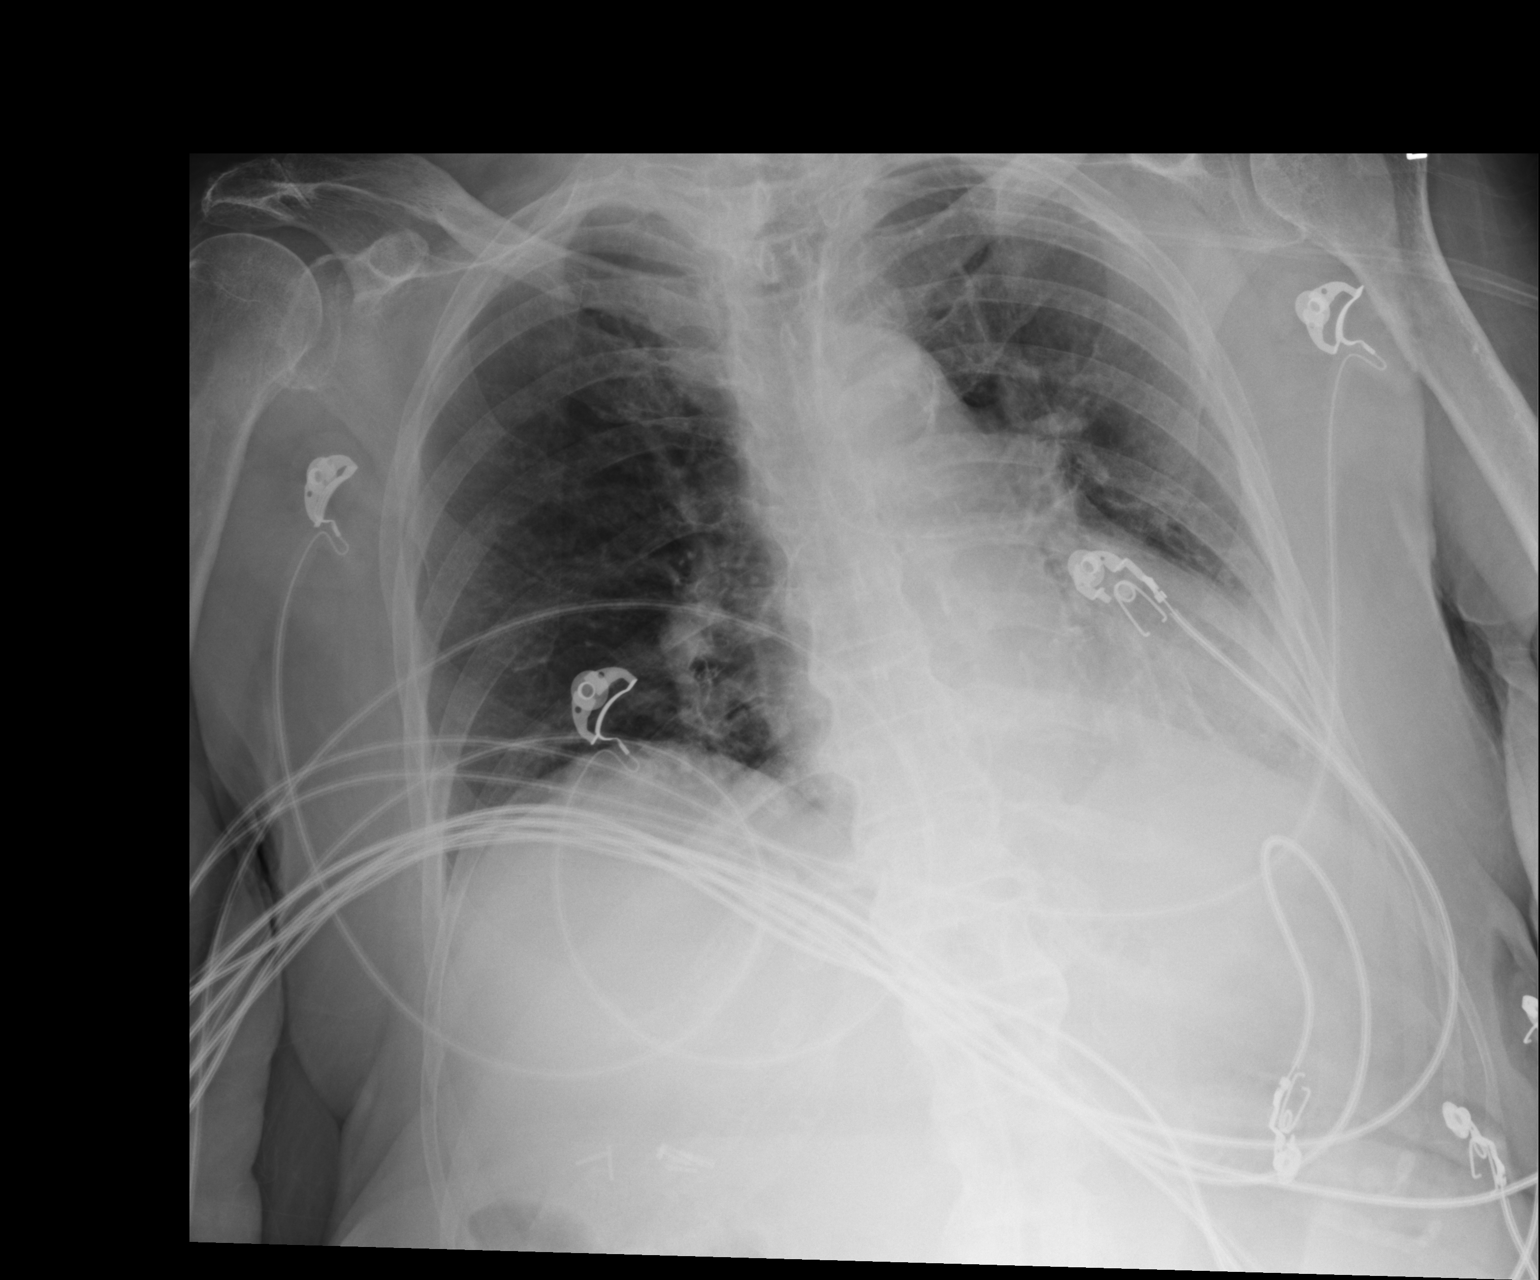

[1 of 1 positions shown; findings below may reference images not displayed]

FINDINGS: Stable cardiomegaly and central vascular congestion. Similar low
lung volumes with left basilar streaky opacities compatible with
scarring. No significant interval change or new finding. No
superimposed CHF or edema. Negative for pneumothorax. Trachea is
midline. Atherosclerosis of the aorta. Degenerative changes noted of
the spine. Prior cholecystectomy evident.
IMPRESSION: Cardiomegaly without CHF

Low volume exam with similar left basilar chronic atelectasis/
scarring.

## 2018-01-02 ENCOUNTER — Encounter: Payer: Self-pay | Admitting: Internal Medicine
